# Patient Record
Sex: Female | Born: 1940
Health system: Southern US, Community
[De-identification: ages and names within clinical notes are randomized; demographics above are authoritative.]

## PROBLEM LIST (undated history)

## (undated) DIAGNOSIS — I471 Supraventricular tachycardia, unspecified: Secondary | ICD-10-CM

## (undated) DIAGNOSIS — C189 Malignant neoplasm of colon, unspecified: Secondary | ICD-10-CM

## (undated) DIAGNOSIS — E785 Hyperlipidemia, unspecified: Secondary | ICD-10-CM

## (undated) DIAGNOSIS — M1990 Unspecified osteoarthritis, unspecified site: Secondary | ICD-10-CM

## (undated) DIAGNOSIS — I10 Essential (primary) hypertension: Secondary | ICD-10-CM

## (undated) DIAGNOSIS — R011 Cardiac murmur, unspecified: Secondary | ICD-10-CM

## (undated) DIAGNOSIS — I4719 Other supraventricular tachycardia: Secondary | ICD-10-CM

## (undated) DIAGNOSIS — B029 Zoster without complications: Secondary | ICD-10-CM

## (undated) DIAGNOSIS — IMO0002 Reserved for concepts with insufficient information to code with codable children: Secondary | ICD-10-CM

## (undated) DIAGNOSIS — I499 Cardiac arrhythmia, unspecified: Secondary | ICD-10-CM

## (undated) DIAGNOSIS — E119 Type 2 diabetes mellitus without complications: Secondary | ICD-10-CM

## (undated) DIAGNOSIS — E039 Hypothyroidism, unspecified: Secondary | ICD-10-CM

## (undated) DIAGNOSIS — K5190 Ulcerative colitis, unspecified, without complications: Secondary | ICD-10-CM

## (undated) HISTORY — DX: Hyperlipidemia, unspecified: E78.5

## (undated) HISTORY — DX: Supraventricular tachycardia, unspecified: I47.10

## (undated) HISTORY — DX: Reserved for concepts with insufficient information to code with codable children: IMO0002

## (undated) HISTORY — DX: Zoster without complications: B02.9

## (undated) HISTORY — DX: Type 2 diabetes mellitus without complications: E11.9

## (undated) HISTORY — PX: COLONOSCOPY: SHX174

## (undated) HISTORY — DX: Hypothyroidism, unspecified: E03.9

## (undated) HISTORY — DX: Essential (primary) hypertension: I10

## (undated) HISTORY — DX: Ulcerative colitis, unspecified, without complications: K51.90

## (undated) HISTORY — PX: HIP ARTHROPLASTY: SHX981

## (undated) HISTORY — PX: LUMBAR FUSION: SHX111

## (undated) HISTORY — DX: Supraventricular tachycardia: I47.1

## (undated) HISTORY — DX: Malignant neoplasm of colon, unspecified: C18.9

---

## 1999-10-26 HISTORY — PX: COLON RESECTION: SHX5231

## 2000-07-07 ENCOUNTER — Encounter: Payer: Self-pay | Admitting: General Surgery

## 2000-07-07 ENCOUNTER — Encounter: Admission: RE | Admit: 2000-07-07 | Discharge: 2000-07-07 | Payer: Self-pay | Admitting: General Surgery

## 2000-07-12 ENCOUNTER — Encounter: Payer: Self-pay | Admitting: General Surgery

## 2000-07-15 ENCOUNTER — Encounter (INDEPENDENT_AMBULATORY_CARE_PROVIDER_SITE_OTHER): Payer: Self-pay | Admitting: Specialist

## 2000-07-15 ENCOUNTER — Inpatient Hospital Stay (HOSPITAL_COMMUNITY): Admission: RE | Admit: 2000-07-15 | Discharge: 2000-08-03 | Payer: Self-pay | Admitting: General Surgery

## 2000-07-25 ENCOUNTER — Encounter: Payer: Self-pay | Admitting: General Surgery

## 2000-07-26 ENCOUNTER — Encounter: Payer: Self-pay | Admitting: General Surgery

## 2000-07-29 ENCOUNTER — Encounter: Payer: Self-pay | Admitting: General Surgery

## 2000-07-31 ENCOUNTER — Encounter: Payer: Self-pay | Admitting: General Surgery

## 2000-08-01 ENCOUNTER — Encounter: Payer: Self-pay | Admitting: General Surgery

## 2004-11-23 ENCOUNTER — Ambulatory Visit: Payer: Self-pay | Admitting: Oncology

## 2005-12-02 ENCOUNTER — Ambulatory Visit: Payer: Self-pay | Admitting: Oncology

## 2006-03-02 ENCOUNTER — Inpatient Hospital Stay (HOSPITAL_COMMUNITY): Admission: RE | Admit: 2006-03-02 | Discharge: 2006-03-07 | Payer: Self-pay | Admitting: Orthopaedic Surgery

## 2007-06-24 IMAGING — CR DG LUMBAR SPINE 2-3V
1 series · 1 of 1 positions shown · non-contrast
Comparison: None.

CLINICAL DATA: Spondylolisthesis with foraminal stenoses.  L-4 through S-1 fusion.  
 INTRAOPERATIVE LUMBAR SPINE ? 3 VIEW:

[view not recorded]
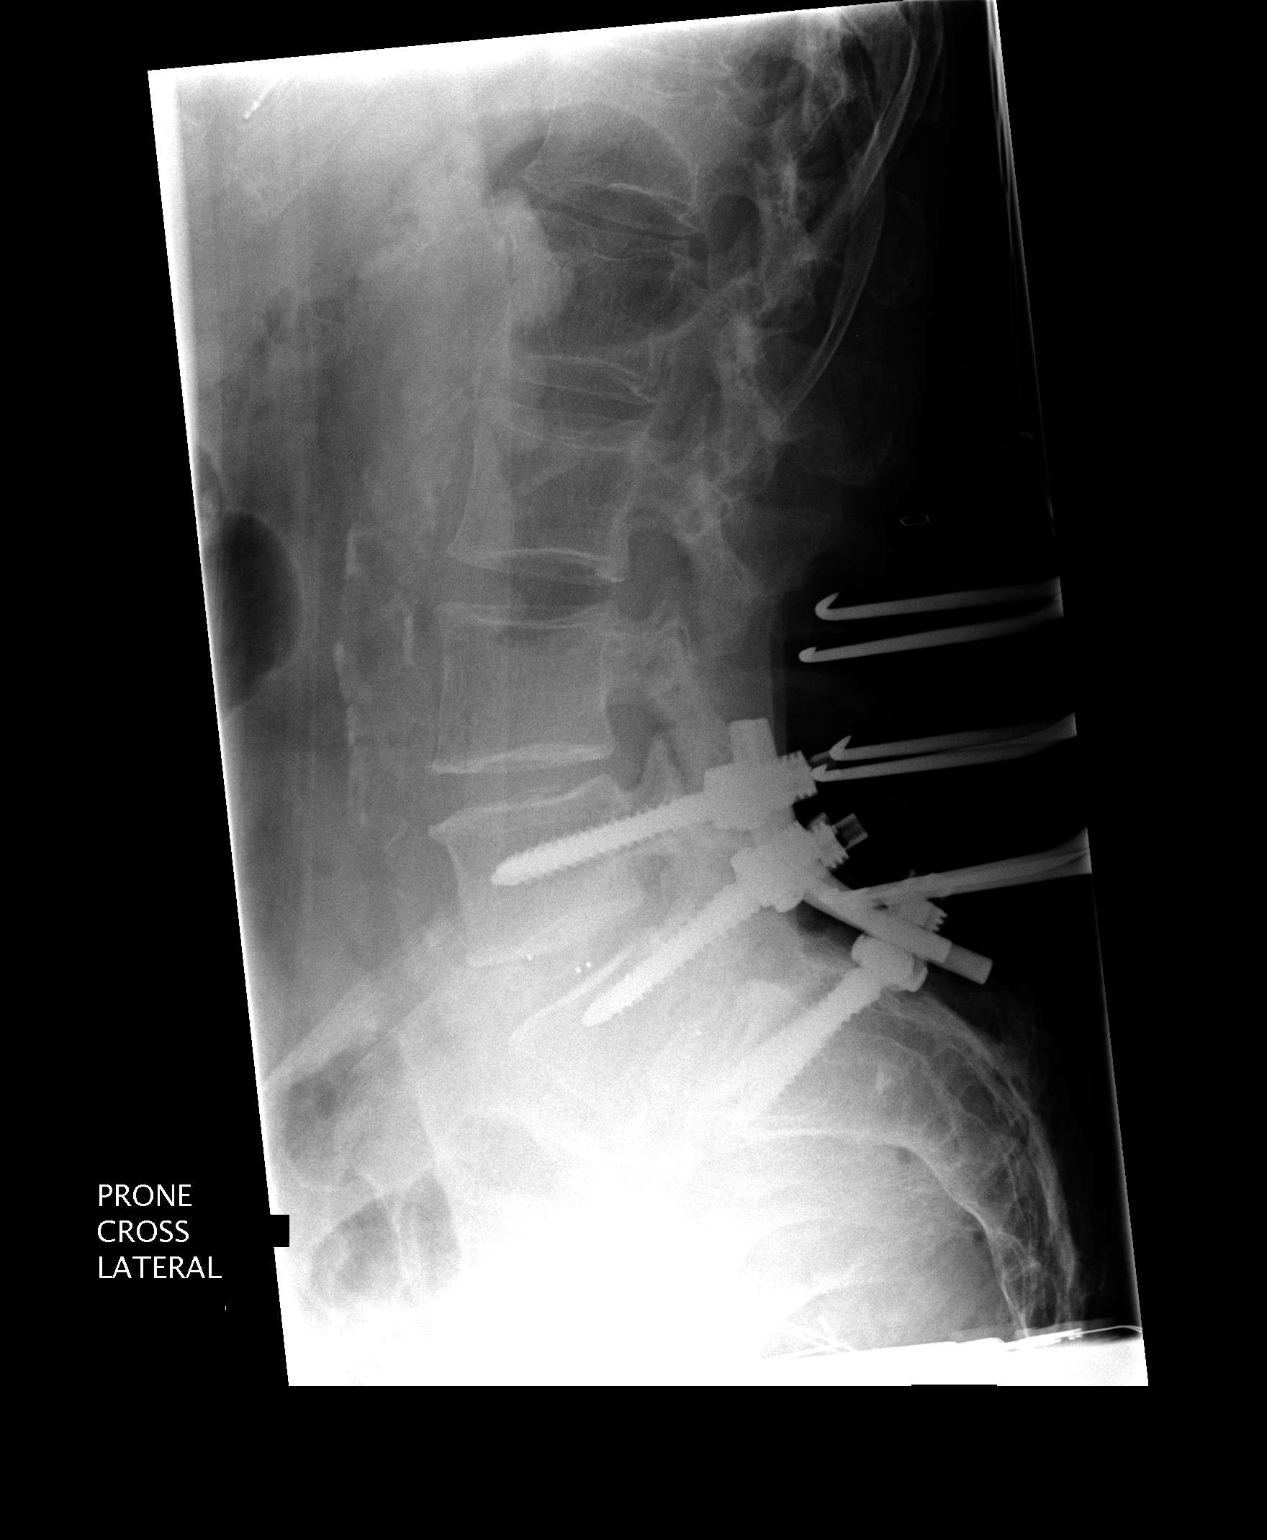

[1 of 1 positions shown; findings below may reference images not displayed]

FINDINGS: For the purposes of this dictation, I am going to assume 5 non-rib bearing lumbar vertebrae.  These images were submitted for interpretation post-operatively.  
 The initial image obtained at [DATE] hours shows clamps on the spinous processes of L-3 and L-4.
 The second image obtained at [DATE] hours shows pedicle screws at L-4, L-5, and S-1.  An interbody fusion plug has been placed at L5-S1.  
 The 3rd image obtained at [DATE] hours shows plate and screw fixation from L-4 through S-1.  Interbody fusion plugs are present at L4-5 and L5-S1.  There has been some improvement in the L5-S1 spondylolisthesis post-fusion.
IMPRESSION: Posterolateral fusion with hardware from L-4 through S-1 with interbody fusion plugs at L4-5 and L5-S1.

## 2007-06-26 IMAGING — CR DG ABD PORTABLE 1V
1 series · 1 of 1 positions shown · non-contrast
Comparison: none

CLINICAL DATA: Status post L5-S1 fusion with abdominal distention.
 ABDOMEN ? 1 VIEW:

[view not recorded]
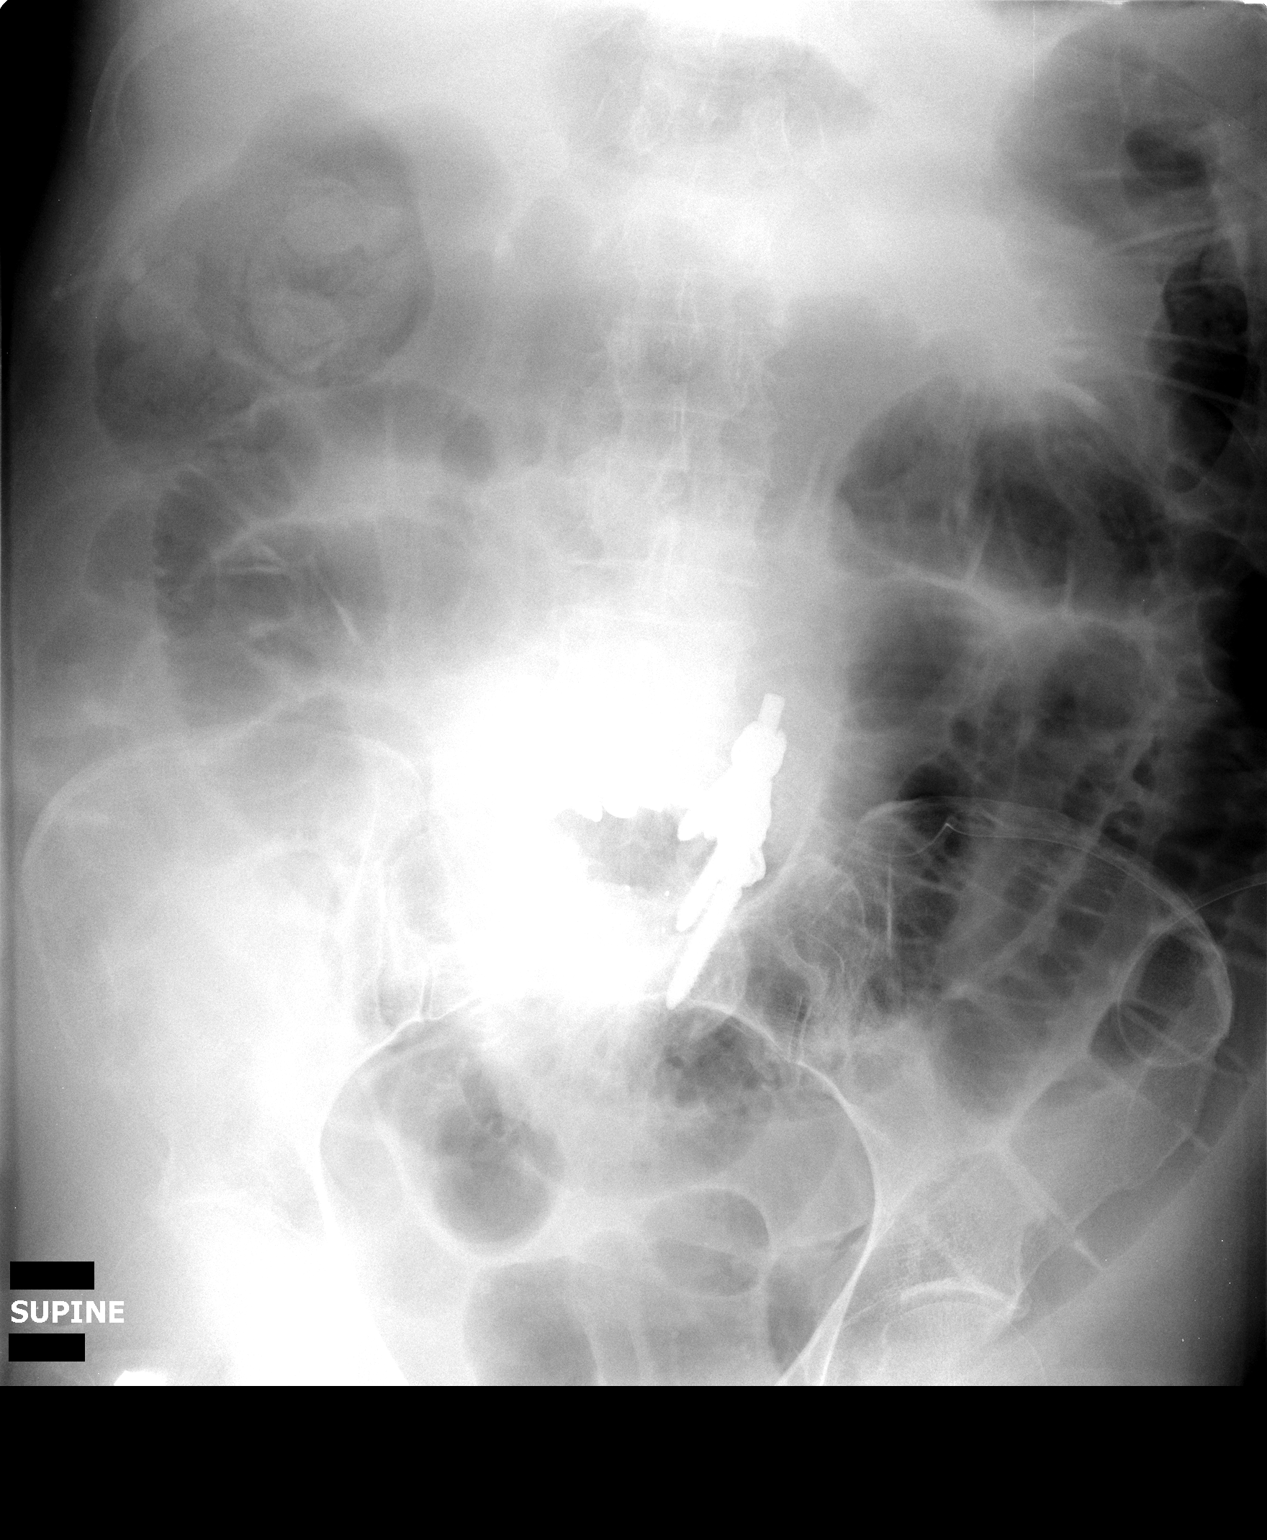

[1 of 1 positions shown; findings below may reference images not displayed]

FINDINGS: L5-S1 hardware is noted.  Diffuse gaseous distention of the small and large bowel.  This is most consistent with an ileus.  Exam is limited because of portable technique and patient size.
IMPRESSION: Diffuse gaseous distention of bowel most consistent with an ileus.

## 2011-03-12 NOTE — Discharge Summary (Signed)
[name], [name]             ACCOUNT NO.:  [id number]   MEDICAL RECORD NO.:  [id number]          PATIENT TYPE:  INP   LOCATION:  5012                         FACILITY:  [facility]   PHYSICIAN:  Sharolyn Douglas, M.D.        DATE OF BIRTH:  04-09-1941   DATE OF ADMISSION:  03/02/2006  DATE OF DISCHARGE:  03/07/2006                                 DISCHARGE SUMMARY   ADMITTING DIAGNOSES:  1.  L5-S1 spondylolisthesis.  2.  History of cardiac arrhythmia.  3.  Diabetes.  4.  History of colon cancer.   DISCHARGE DIAGNOSES:  1.  Status post L4-S1 posterior spinal fusion, doing well.  2.  Postoperative blood loss anemia, stable.  3.  Postoperative hyponatremia.  4.  Postoperative hyperglycemia.  5.  L5-S1 spondylolisthesis.  6.  History of cardiac arrhythmia.  7.  Diabetes.  8.  History of colon cancer.  9.  Postoperative ileus, resolved prior to discharge.   PROCEDURE:  On Mar 02, 2006, the patient was taken to the operating room for  an L4-S1 posterior spinal fusion with pedicle screws, Gill laminectomy of  L5, as well as a TLIF, L4-5 and L5-S1.   SURGEON:  Sharolyn Douglas, MD   ASSISTANT:  Verlin Fester, PAC.   ANESTHESIA:  General.   CONSULTS:  None.   LABS:  CBC with diff preop was within normal limits.  Postoperatively, H&H  were monitored for 3 days.  Reached a low of 10.8 and 32.2 on Mar 03, 2006.  She was stable and did not require any treatment.  PT, INR and PTT preop  normal.  Complete metabolic panel preop was normal with the exception of a  glucose of 108.  Postoperatively, basic metabolic panel was monitored for 2  days.  On postoperative day 1, glucose was 122, BUN 5; otherwise, normal.  On postoperative 2, sodium was 132, chloride 95, glucose 120, and BUN 4;  otherwise, normal.  UA from Feb 28, 2006, was negative.  Urine culture from  Feb 28, 2006, showed no growth.  Blood typing from Feb 28, 2006, was type O  positive, antibody screen negative.  EKG from Mar 02, 2006, showed  normal  sinus rhythm, possible inferior infarct, age undetermined.  There was no  previous EKG present, confirmed by Lenise Herald, MD.  X-rays from Mar 02, 2006, do show posterior fusion hardware from L4-S1 and interbody grafts at  L4-5 and 5-1 in good position and alignment.  Abdominal x-rays from Mar 04, 2006, showed gastric distension of the bowel, most consistent with an ileus.  X-rays of the lumbar spine from Mar 07, 2006, showed L4-S1 posterior spinal  fusion with grade 1 anterolisthesis at L5-1.   BRIEF HISTORY:  The patient is a 70 year old female who has had increased  back and left lower extremity pain for a period of several months.  She had  unfortunately failed conservative treatments that we had tried with her, and  her pain was progressively getting worse.  It had gotten to the point that  it was interfering with her activities of daily living,  as well as her  quality of life was suffering significantly.  The risks and benefits of the  procedure that was proposed were discussed with the patient by Dr. Noel Gerold, as  well as myself.  She indicated understanding and opted to proceed.   HOSPITAL COURSE:  On Mar 02, 2006, the patient was taken to the operating  room and underwent the above-listed procedure.  She tolerated the procedure  well without any intraoperative complications and was transferred to the  recovery room in stable condition.  Blood loss was approximately 500 mL,  with 400 returned via Cell Saver.   Postoperatively, routine orthopedic spine protocol was followed.  She  progressed along relatively well.  She did develop some abdominal distension  on Mar 04, 2006.  An x-ray was done of her abdomen as previously dictated  and did show some distension of her bowels.  Her medications were changed.  Her Colace was changed to Peri-Colace.  She was started on Reglan, and  laxative and enemas were started on an as-needed basis.  By the following  day, she was doing  much better.  She had passed gas, as well as had a bowel  movement.  Thought her ileus had resolved.  Her diet was slowly advanced  from that point forward, and she continued to make good progress.  By Mar 10, 2006, the patient was doing very well.  She had progressed well with  physical therapy.  She was eating and drinking without difficulty.  Her  abdomen was decompressed.  It was nontender and nondistended.   DISCHARGE PLAN:  The patient is a 70 year old female status post posterior  spinal fusion, doing well.  Activities:  Daily ambulation, brace on when she  is up, back precautions at all times, no lifting heavier than 5 pounds,  dressing changes daily, keep incision dry until all drainage stops.  Follow  up 2 weeks postoperatively with Dr. Noel Gerold.  Call for appointment.   MEDICATIONS ON DISCHARGE:  Resume home medications.  Percocet for pain.  Robaxin for muscle spasm.  Multivitamin daily.  Calcium daily.  Laxative as  needed.   DIET ON DISCHARGE:  Resume her preoperative diet.   CONDITION ON DISCHARGE:  Stable, improved.   DISPOSITION:  The patient being discharged to her home with her family's  assistance, as well as Home Health Physical Therapy and Occupational  Therapy.      Verlin Fester, P.A.      Sharolyn Douglas, M.D.  Electronically Signed    CM/MEDQ  D:  04/28/2006  T:  04/28/2006  Job:  [id number]

## 2011-03-12 NOTE — Op Note (Signed)
[name], BAAR             ACCOUNT NO.:  [id number]   MEDICAL RECORD NO.:  [id number]          PATIENT TYPE:  INP   LOCATION:  2889                         FACILITY:  [facility]   PHYSICIAN:  Sharolyn Douglas, M.D.        DATE OF BIRTH:  02-Jun-1941   DATE OF PROCEDURE:  03/02/2006  DATE OF DISCHARGE:                                 OPERATIVE REPORT   DIAGNOSES:  1.  Isthmic spondylolisthesis, L5-S1 with severe foraminal narrowing, left      greater than right.  2.  L4-5 degenerative disk disease with foraminal narrowing.  3.  Chronic back and left greater than right leg pain.   POSTOPERATIVE DIAGNOSES:  1.  Isthmic spondylolisthesis, L5-S1 with severe foraminal narrowing, left      greater than right.  2.  L4-5 degenerative disk disease with foraminal narrowing.  3.  Chronic back and left greater than right leg pain.   PROCEDURE:  1.  L4-5 and L5-S1 laminectomy with wide decompression of the thecal sac and      nerve roots bilaterally.  2.  Transforaminal lumbar interbody fusion, L4-5 and L5-S1 with placement of      two PEEK prosthetic cages.  3.  Segmental pedicle screw instrumentation, L4-S1 using the Abbott spine      system.  4.  Posterior spinal arthrodesis, L4-S1  5.  Local autogenous bone graft supplemented with bone morphogenic protein.   SURGEON:  Sharolyn Douglas, MD   ASSISTANT:  Verlin Fester, P.A.   ANESTHESIA:  General endotracheal.   ESTIMATED BLOOD LOSS:  500 mL.   COMPLICATIONS:  None.   INDICATIONS:  The patient is a pleasant 70 year old female with  progressively worsening back and left greater than right leg pain.  She has  grade 1 isthmic spondylolisthesis at L5-S1, retrolisthesis of L4 on 5.  There is severe foraminal narrowing, left greater than right at L5-S1 and  right greater than left at L4-5.  She has failed to respond to all  conservative treatment measures and now elects to undergo lumbar  decompression and fusion in hopes of improving her  symptoms.  Risk and  benefits were reviewed.   PROCEDURE:  The patient was identified in the holding area, taken to the  operating room.  She underwent general endotracheal anesthesia without  difficulty given prophylactic IV antibiotics.  Neuro monitoring was  established form of SSEPs and lower extremity EMGs.  She was then turned  prone onto the [place] frame.  All bony prominences padded.  Face eyes  protected all times.  Back prepped, draped usual sterile fashion.  Midline  incision made from L3 down to the sacrum.  Dissection was carried through  the deep fascia.  Subperiosteal exposure carried out to the tips the  transverse process of L4-L5 and sacral ala bilaterally.  The L5 spinous  process was loose consistent with the pars fracture.  Deep retractors were  placed.  Intraoperative x-ray taken to confirm levels.  The lordotic angle  was increased due to the spondylolisthesis at L5-S1.  The L4-5 facette  joints were impinging on the L5-S1 joints.  We then turned our attention to  completing a lumbar laminectomy by removing the entire spinous process and  lamina of L5.  The pars fracture was identified.  The fibrocartilaginous  material was decompressed from the canal.  We noted on the left side that  the L5 nerve roots were being encumbered by the fibrocartilaginous material  and also tension between the bulging disk of L5-S1 and the pedicle of L5  within the foramen.  Similar findings were found on the right side but less  severe.  At L4-5 the spinous process and lamina again was removed.  We found  a ruptured disk into the foramen on the right side at L4-5.  Once we were  satisfied with decompression the thecal sac and nerve roots on both sides we  turned our attention to placing pedicle screws at L4, L5-S1 using anatomic  probing technique.  We could palpate the pedicles from the spinal canal.  Each pedicle hole was started with the pedicle wall followed by the pedicle  probe.   The bone quality was soft.  The pedicles were tapped.  We placed 6.5  x 50 mm screws at L4, 6.5 x 40 mm screw at L5 on the left and a 6.5 x 45 mm  screw at L5 on the right.  7.5 x 45 mm screws were used in the sacrum.  We  then turned our attention to completing a transforaminal lumbar interbody  fusion at L4-5 and L5-S1.  Starting on the left side at L5-S1, the remaining  facette joint was osteotomized.  The exiting transversing nerve roots were  identified.  The disk space was entered.  We then dilated the disk space up  to 9 mm using intervertebral spreaders.  TLIF instruments were used to  complete a radical diskectomy across the contralateral side, scraping  cartilaginous endplates.  The disk space was packed with local bone graft  obtained from the laminectomy along with BMP sponges from the medium Infuse  kit.  We then inserted a 9-mm PEEK cage packed with a BMP sponge into the L5-  S1 interspace tamped it anteriorly and across the midline.  This provided  distraction and the L5 nerve root was found to be decompressed within the  foramen once the TLIF had been placed.  We then performed a similar  procedure on the right side at L4-5.  Again the remaining facette joint was  osteotomized.  The disk space was entered and cleaned out.  This space  dilated up to 11 mm.  BMP and local bone graft packed into the interspace  followed by the 11-mm PEEK cage.  This again was tamped anteriorly and  across the midline.  Intraoperative x-ray showed appropriate positioning of  the pedicle screws and the interbody TLIF grafts.  We then turned our  attention to completing the posterior spinal arthrodesis by decorticating  the transverse process of L4, L5 and S1 bilaterally.  Remaining local bone  graft was tightly packed into the lateral gutters.  70 mm titanium rods were  bent into lordosis and placed in the polyaxial screw heads.  We applied  gentle compression across the L4-5 segment.  We sheared  off the locking caps.  Cross connector was placed.  Wound was irrigated.  Hemostasis  achieved.  Gelfoam left over the exposed epidural space.  Deep fascia closed  with a #1 Vicryl suture.  Subcutaneous layer closed with 0 Vicryl and 2-0  Vicryl followed by a running 3-0 subcuticular Vicryl  suture on the skin  edges.  Benzoin, Steri-Strips placed.  Sterile dressing applied.  Needle and  sponge count was correct.  It should be noted that my assistant helped  throughout the entire case including during the exposure as well as with the  pedicle screws and the decompression working with the sucker and nerve root  retractors.  She monitored the exiting and transversing nerve roots during  the T11 procedures.  And then assisted with wound closure.      Sharolyn Douglas, M.D.  Electronically Signed     MC/MEDQ  D:  03/02/2006  T:  03/03/2006  Job:  [id number]

## 2011-03-12 NOTE — Consult Note (Signed)
[facility]  Patient:    [name], [name]                    MRN: [id number] Adm. Date:  [id number] Attending:  Carson Myrtle CC:         Sheppard Plumber. Earlene Plater, M.D.  Rose Phi. Maple Hudson, M.D.  Maebelle Munroe, M.Ronnald Nian, [place]   Consultation Report  DATE OF BIRTH:  [birth year]  REASON FOR CONSULTATION:  Colon cancer.  HISTORY OF PRESENT ILLNESS:  The patient is a 70 year old woman who was found with OB positive stools at her regular physical in 2000.  She underwent colonoscopy in August 2000 and was found with colitis and a few polyps.  She was also found with iron-deficiency anemia and started on iron around the same time.  The polyps were biopsied and were benign.  She has been intermittently on iron for the last one year.  On her routine physical in 2001, she had again OB positive stools.  Colonoscopy was peformed on June 17, 2000 and a mass in the proximal ascending colon was seen.  There were polyps in the proximal sigmoid colon and rectum.  There was no colitis.  The biopsy proved to be invasive, moderately differentiated adenocarcinoma.  The rectosigmoid polyp was a tubular adenoma.  The patient then underwent CAT scan of her abdomen and pelvis that showed mural thickening of the ascending colon with slight adjacent infiltration of the mesenteric fat.  There was no evidence for metastatic disease in the abdomen or pelvis.  She had degenerative disk disease with possible spondylolisthesis, as well.  On September, a chest x-ray was done, and it was free of metastatic disease.  On September 21, the patient underwent a right colectomy and, on the pathology specimen, the tumor was 7 cm in diameter.  It was a moderately differentiated colonic adenocarcinoma extending into muscularis propria without lymph node metastases.  All of the specimen margins were free of tumor, with 0 out of 14 lymph nodes involved by cancer.  There was some  desmoplastic reaction noted in and beyond the muscularis propria, but there were no malignant glands identified in that area.  I was asked to see the patient in consultation for her colon cancer.  PAST MEDICAL HISTORY: 1. "Irregular heart beat." 2. Question hypothyroidism on Synthroid. 3. Iron replacement for a year for presumed iron-deficiency anemia.  ALLERGIES:  ASPIRIN with GI INTOLERANCE.  MEDICATIONS PRIOR TO ADMISSION:  Synthroid.  Hormone replacement therapy. Lipitor.  Toprol XL.  Dilacor.  Ferrous sulfate.  SOCIAL HISTORY:  She lives near [place].  She quit smoking three years ago and did smoke for 30 years.  She does not drink alcohol.  She is employed, married and lives with her husband.  She has one son.  FAMILY HISTORY:  Her father passed away at the age of 85 of metastatic cancer reaching into the skin.  REVIEW OF SYSTEMS:  CONSTITUTIONAL: The patient felt good.  No weight loss. No night sweats.  No fever.  Good level of energy and good appetite prior to her surgery.  EARS, NOSE AND THROAT: No complaints.  EYES: No complaints. GASTROINTESTINAL: No regurgitation.  No dysphagia, odynophagia, nausea, vomiting, abdominal pain, diarrhea, constipation, black stools or blood stools.  CARDIOVASCULAR AND RESPIRATORY: No palpitations.  No chest pain. No shortness of breath.  No cough.  MUSCULOSKELETAL: Some arthritis in her knees. GENITOURINARY: No dysuria.  No frequency.  LABORATORY DATA:  See the history  of present illness for pathology, chest x-ray and CAT scan.  On July 04, 2000, white count 8.9, hemoglobin 11.1, MCV 83.1, platelets 505.  Sodium 141, BUN 12, creatinine 0.8, albumin 3.8.  CEA 1.4.  PHYSICAL EXAMINATION:  GENERAL:  Middle-age woman in no apparent distress.  Comfortable in bed.  VITAL SIGNS:  Blood pressure 158/84, heart rate 89, respirations 20, temperature 97.  HEENT:  Normocephalic and atraumatic.  Pupils equal, round and reactive to light  and accommodation.  Extraocular movements intact.  Ear, nose and throat grossly normal.  NECK:  No JVD.  No lymphadenopathy.  No thyromegaly.  No bruits.  CHEST:  Clear to auscultation bilaterally.  HEART:  Regular.  ABDOMEN:  Slightly distended.  It is dressed from the recent surgery. Minimally tender around the wound.  Bowel sounds are absent.  EXTREMITIES:  There is no peripheral edema.  NEUROLOGIC:  The patient is grossly intact.  IMPRESSION AND PLAN: T2, N0, M0 (stage 1) colorectal cancer, moderately differentiated, status post right hemicolectomy on July 15, 2000.  The patients outcome should be excellent with 90% long term disease free survival.  I did arrange follow up with Dr. Maebelle Munroe in [place] on August 03, 2000 at 2:45 p.m.   I discussed with the patient and her spouse about her condition, diagnosis, prognosis and follow up.  All of their questions were answered. DD:  07/19/00 TD:  07/19/00 Job: [id number]

## 2011-03-12 NOTE — Op Note (Signed)
[facility]  Patient:    [name], [name]                    MRN: [id number] Proc. Date: 07/15/00 Adm. Date:  [id number] Attending:  Carson Myrtle                           Operative Report  PREOPERATIVE DIAGNOSIS:  Carcinoma, right colon.  POSTOPERATIVE DIAGNOSIS:  Carcinoma, right colon.  PROCEDURE:  Right colectomy.  SURGEON:  Timothy E. Earlene Plater, M.D.  ASSISTANT:  Gita Kudo, M.D.  ANESTHESIA:  CRNA, supervised M.D.  DESCRIPTION OF PROCEDURE:  The patient was taken to the operating room, placed supine.  General endotracheal anesthesia was administered.  A nasogastric tube, PAS hose, and Foley catheter were placed.  The abdomen was scrubbed, prepped, and draped in the usual fashion.  A short midline incision was used around the umbilicus to the right.  The fascia was identified, opened in the midline, and the peritoneum was entered.  Careful palpation of the upper abdomen was negative, most particularly, the liver, gallbladder, and stomach area.  She had had a recent colonoscopy, knowing that the entire colon was negative except for the mid-ascending colon, which in fact contained a carcinoma.  It was sizeable, apple-core type, had punctuated the serosa but did not appear to have invaded the serosa.  There was no evidence of metastases.  The small bowel was normal in its entirety.  The pelvis showed an atrophic uterus and bilateral normal ovaries.  We divided the lateral peritoneal reflection of the right colon widely and reflected the entire right colon up into the incision.  We chose lines of resection at the junction of the hepatic flexure and transverse colon and the terminal ileum.  These areas were dissected, cut across with the GIA staple device, and then the mesentery was excised widely down to the apex of the mesentery, and the entire specimen passed off the field.  The clamps on the mesentery were carefully tied with 2-0  silk, and the area was dry.  There was no blood and no blood loss.  The duodenum, kidney, ureter were all kept posterior to the dissection. Anastomosis was created in a typical side-to-side fashion between the terminal ileum and the transverse colon by applying a GIA staple device, firing the device.  We inspected the anastomosis.  It was intact, and there was no bleeding.  The puncture sites were closed with an additional firing of the GIA staple device, which completed the anastomosis.  This lay nicely in the right upper quadrant.  The mesentery was carefully closed with interrupted sutures. All areas were inspected.  There was no bleeding.  The small bowel was allowed to lay in the midabdomen in its normal position.  The omentum was pulled down over this and with the counts correct, the abdomen was closed in a single layer with #1 PDS suture.  Subcutaneous tissue was copiously irrigated.  The skin was closed with _____ staples.  Again, the second count was correct. She had tolerated it well, and she was removed to the recovery room in good condition. DD:  07/15/00 TD:  07/18/00 Job: [id number]

## 2011-03-12 NOTE — H&P (Signed)
[name], [name]             ACCOUNT NO.:  [id number]   MEDICAL RECORD NO.:  [id number]          PATIENT TYPE:  INP   LOCATION:  NA                           FACILITY:  [facility]   PHYSICIAN:  Verlin Fester, P.A.    DATE OF BIRTH:  1941-04-25   DATE OF ADMISSION:  03/02/2006  DATE OF DISCHARGE:                                HISTORY & PHYSICAL   CHIEF COMPLAINT:  Low back and left lower extremity pain.   HISTORY OF PRESENT ILLNESS:  The patient is a 70 year old female who has  been having increasing back and left lower extremity pain for a period of  several months now.  She has unfortunately failed all conservative  treatments.  She never had any lasting results.  Her pain is increasing and  interfering with activities of daily living and quality of life.  Secondary  to her physical exam findings, failure to improve to with conservative  management as well as her x-ray and MRI findings, it was felt her best  course of management would be posterior spinal fusion at L5-S1, possibly at  L4-5.  Risks and benefits of the procedure were discussed with the patient  by Dr. Noel Gerold as well as myself.  She indicated understanding and opts to  proceed.   ALLERGIES:  NONE.   MEDICATIONS:  Toprol, diltiazem, Lipitor, metformin, Synthroid, Arthrotec,  and aspirin.  She stopped aspirin in preparation for surgery.   PAST MEDICAL HISTORY:  Cardiac arrhythmia which is currently stable.  Diabetes and history of colon cancer.   PAST SURGICAL HISTORY:  Colon resection.   SOCIAL HISTORY:  The patient denies tobacco use, denies alcohol use.  She is  widowed.  She has a son and daughter-in-law who will be helping her through  her postoperative course.  They will be staying with her.  They do work full  time.   FAMILY HISTORY:  Family medical history is noncontributory.   REVIEW OF SYSTEMS:  The patient denies fevers, chills, sweats or bleeding  tendencies.  CNS:  Denies blurred vision, double  vision, seizures, headache,  paralysis.  CARDIOVASCULAR:  Denies chest pain, angina, orthopnea,  claudication and palpitations.  PULMONARY:  Denies shortness of breath,  productive cough or hemoptysis.  GI:  Denies nausea, vomiting, constipation,  diarrhea, melena or bloody stools.  GU:  Denies dysuria, hematuria or  discharge.  MUSCULOSKELETAL:  As per HPI.   PHYSICAL EXAMINATION:  VITAL SIGNS:  Blood pressure is 140/78, respirations  are 20 and unlabored, pulse is 92 and regular.  GENERAL APPEARANCE:  This is a 70 year old white female who is alert and  oriented in no acute distress, well nourished, well groomed, appears stated  age, pleasant and cooperative to exam.  HEENT:  Head is normocephalic, atraumatic.  Pupils equal, round, reactive.  Extraocular movements intact.  Nares patent.  Pharynx clear.  Neck is soft  to palpation.  No bruits appreciated.  No lymphadenopathy or thyromegaly  noted.  CHEST:  Clear to auscultation bilaterally.  No rales, rhonchi, stridor,  wheezes or friction rubs.  BREASTS:  Not pertinent, not performed.  HEART:  S1 and S2 regular rate and rhythm.  No murmurs, gallops or rubs  noted.  ABDOMEN:  Soft to palpation, nontender, nondistended.  No organomegaly  noted.  Positive bowel sounds throughout.  GU:  Not pertinent, not performed.  EXTREMITIES:  As per HPI.  SKIN:  Intact without any lesions or rashes.   IMPRESSION:  1.  L5-S1 spinal listhesis with chronic pain and radiculopathy.  2.  History of cardiac arrhythmia, currently stable.  3.  Diabetes.  4.  History of colon cancer status post partial colon resection.   PLAN:  1.  Admit to [facility] on Mar 02, 2006 for an L5-S1 posterior      spinal fusion and a possible L4-5 fusion.  This will be done by Dr. Sharolyn Douglas.  2.  The patient's primary care doctor is Dr. Belva Crome in [place], [place].  She has been cleared preoperatively to undergo surgery.  3.  The patient was  fitted with a lumbar LSO brace today that she will use      for her postoperative course that is medically necessary.      Verlin Fester, P.A.     CM/MEDQ  D:  03/01/2006  T:  03/01/2006  Job:  [id number]

## 2011-03-12 NOTE — Discharge Summary (Signed)
[facility]  Patient:    [name], [name]                    MRN: [id number] Adm. Date:  [id number] Disc. Date: [id number] Attending:  Carson Myrtle                           Discharge Summary  FINAL DIAGNOSES:  Adenocarcinoma, right colon, prolonged postoperative ileus.  HISTORY OF PRESENT ILLNESS:  The patient was seen, evaluated and worked up by Dr. Derry Skill in [place] for known iron deficiency anemia. She had had a colonoscopy 1 year prior in September of 2000, no lesions were seen. She continued on iron and she continued to be anemic. A repeat colonoscopy and upper endoscopy were done and showed a carcinoma right colon. She was referred for definitive surgery. She was counseled in the office, prepared at home, brought into the hospital on the 21st and operated and underwent a right colectomy. Her initial postoperative evaluation and care were routine. She remained afebrile and had no early flatus or bowel action. We had not used a nasogastric tube at surgery. We tried some clear liquids and Dulcolax suppositories. She was then passing stool and flatus but made almost no progress in terms of the actual size of the abdomen or other physical findings. Finally, she became nauseated with vomiting and the nasogastric tube was placed on July 24, 2000. Stools were checked for C. difficile. The laboratory data were followed as well. Flat and upright abdomen x-rays were followed which showed changes consistent with ileus. Laboratory data were okay. C diff levels were negative. We had discussed carefully with her, the options for TPN versus reoperation versus time. She elected for time only and finally with the bowels continuing to work the abdomen became softer, the vomiting stopped and she tolerated a diet and was ready for discharge on the 18th.  Her laboratory data showed a hemoglobin of 11.6 on admission, 11.5 on discharge, again she  was known to be iron deficiency. C. difficile toxins were negative. EKG showed borderline ECG. Chest x-ray was negative. Final path report showed slight colonic carcinoma moderately differentiated extending into the muscularis propria without lymph node metastasis. This is a T2N0. She was seen by oncology and they deferred treatment at this time and will see her in follow-up. That appointment is made with Dr. Maebelle Munroe in [place] in early November.  The patient is discharged home with pain medication to resume her usual medications and will be followed from the office. DD:  08/11/00 TD:  08/11/00 Job: [id number]

## 2011-03-12 NOTE — H&P (Signed)
[facility]  Patient:    [name], [name]                    MRN: [id number] Adm. Date:  [id number] Attending:  Carson Myrtle                         History and Physical  ADMITTING DIAGNOSIS:  Carcinoma of right colon.  HISTORY OF PRESENT ILLNESS:  This is a 70 year old Caucasian female from [place] who sees and follows up with Dr. Beulah Gandy. Sheria Lang and has seen Dr. Garnette Czech.  She has a known iron deficiency anemia.  A GI workup was completed one year ago and was negative and upon this workup at this time, a right colon carcinoma was identified in the mid-ascending colon.  Patient has been carefully advised and consulted and wishes to proceed urgently with this surgery as is planned.  She is known to be anemic and has been on iron.  Her bowels have never been constipated, in fact, tend to be loose.  There was a question of diagnosis of colitis last year but no treatment was accomplished.  No other history of GI problems.  Patient has hypertension and is on Diltiazem 240 mg a day and Toprol XL 50 mg a day, history of low thyroid, on Synthroid 0.15 mg a day, high cholesterol, Lipitor 20 mg a day, and postmenopausal, on replacement estrogen.  ALLERGIES:  No drug allergies.  PAST SURGICAL HISTORY:  No prior surgery.  SOCIAL HISTORY:  Patient stopped smoking in 1998.  She does not drink alcohol.  OBSTETRICAL HISTORY:  She is a gravida 1, para 1.  FAMILY HISTORY:  Father died with cancer, thought to be pancreas, exact site unknown.  Mother deceased, no disease known.  Two brothers and two sisters alive and well.  LABORATORY DATA AND IMAGING STUDIES:  CT scan done of the abdomen showing evidence of carcinoma, right colon, no other disease noted.  CEA level 1.4.  Admitting hemoglobin 11 and hematocrit 33.  Chemistry profile normal.  PHYSICAL EXAMINATION  GENERAL:  A pleasant Caucasian female, conversant, informed, of stated age. Her  weight is 214 pounds.  VITAL SIGNS:  Heart rate is 80.  Blood pressure is 120/80.  HEENT:  Unremarkable.  NECK:  Free of masses.  CHEST:  Clear to auscultation.  HEART:  Heart sounds are of normal quality, regular rhythm.  BREASTS:  Not examined at this time.  ABDOMEN:  Quite large and rotund.  No incisions.  No herniae.  There is some tenderness in the right lower quadrant but no particular mass.  RECTAL:  Stools are known to be Hemoccult-positive.  ASSESSMENT:  Patient is known to have an iron deficiency anemia and a biopsy-proven adenocarcinoma of the right colon.  Again, patient is now prepared and wishes to proceed with this surgery as has been carefully discussed. DD:  07/16/99 TD:  07/16/00 Job: [id number]

## 2014-08-20 ENCOUNTER — Encounter: Payer: Self-pay | Admitting: *Deleted

## 2014-08-21 ENCOUNTER — Encounter: Payer: Self-pay | Admitting: *Deleted

## 2014-08-21 ENCOUNTER — Encounter: Payer: Self-pay | Admitting: Cardiovascular Disease

## 2014-08-21 ENCOUNTER — Ambulatory Visit (INDEPENDENT_AMBULATORY_CARE_PROVIDER_SITE_OTHER): Payer: Medicare Other | Admitting: Cardiovascular Disease

## 2014-08-21 VITALS — BP 120/75 | HR 72 | Ht 67.0 in | Wt 227.0 lb

## 2014-08-21 DIAGNOSIS — I10 Essential (primary) hypertension: Secondary | ICD-10-CM

## 2014-08-21 DIAGNOSIS — I471 Supraventricular tachycardia, unspecified: Secondary | ICD-10-CM

## 2014-08-21 DIAGNOSIS — E1310 Other specified diabetes mellitus with ketoacidosis without coma: Secondary | ICD-10-CM

## 2014-08-21 DIAGNOSIS — E1110 Type 2 diabetes mellitus with ketoacidosis without coma: Secondary | ICD-10-CM

## 2014-08-21 DIAGNOSIS — E785 Hyperlipidemia, unspecified: Secondary | ICD-10-CM

## 2014-08-21 NOTE — Assessment & Plan Note (Signed)
Continue beta blocker try to get strips from urgent care [place].  Continue beta blocker Echo r/o structural heart disease Refer to EP for possible ablation / AV node modification at patient request

## 2014-08-21 NOTE — Assessment & Plan Note (Signed)
Discussed low carb diet.  Target hemoglobin A1c is 6.5 or less.  Continue current medications.  

## 2014-08-21 NOTE — Assessment & Plan Note (Signed)
Well controlled.  Continue current medications and low sodium Dash type diet.    

## 2014-08-21 NOTE — Progress Notes (Addendum)
Patient ID: [name], female   DOB: [birth year], 73 y.o.   MRN: [id number]    73 yo referred by Dr Burnett Sheng for arrhythmia.  CRF  HTN, elevated lipids and poorly controlled diabetes  Review of recent labs shows A1c 7.8  Also history of low T4 On replacement  TSH 2.9  Has had palpitations for years  Ok with beta blocker Last month worse with 4-5 episodes of presumed SVT  Sudden onset can last hours Episode last week at urgent care [place].  Report indicates SVT rate 180 no strips  Slowed on its own  Has had adenosine once.  Doesn't think beta blocker working As well  Tries vagal maneuvers first but don't help.  Thinks recent addition of Actos has made things worse  She is sedentary no chest pain mild exertional dyspnea Widowed but has lots of family in area  She is interested in ablation     ROS: Denies fever, malais, weight loss, blurry vision, decreased visual acuity, cough, sputum, SOB, hemoptysis, pleuritic pain, palpitaitons, heartburn, abdominal pain, melena, lower extremity edema, claudication, or rash.  All other systems reviewed and negative   General: Affect appropriate Overweight white female  HEENT: normal Neck supple with no adenopathy JVP normal no bruits no thyromegaly Lungs clear with no wheezing and good diaphragmatic motion Heart:  S1/S2 no murmur,rub, gallop or click PMI normal Abdomen: benighn, BS positve, no tenderness, no AAA no bruit.  No HSM or HJR Distal pulses intact with no bruits No edema Neuro non-focal Skin warm and dry No muscular weakness  Medications Current Outpatient Prescriptions  Medication Sig Dispense Refill  . aspirin 81 MG tablet Take 81 mg by mouth daily.      . balsalazide (COLAZAL) 750 MG capsule Take 2,250 mg by mouth 3 (three) times daily.      . calcium carbonate 200 MG capsule Take 250 mg by mouth 2 (two) times daily with a meal.      . Diclofenac-Misoprostol (ARTHROTEC) 75-0.2 MG TBEC Take 75 mg by mouth 2 (two) times daily.       Marland Kitchen diltiazem (DILACOR XR) 240 MG 24 hr capsule Take 240 mg by mouth daily.      . fluticasone (FLONASE) 50 MCG/ACT nasal spray Place 2 sprays into both nostrils daily as needed for allergies or rhinitis.      Marland Kitchen levothyroxine (SYNTHROID, LEVOTHROID) 112 MCG tablet Take 112 mcg by mouth daily before breakfast.      . metoprolol succinate (TOPROL-XL) 50 MG 24 hr tablet Take 50 mg by mouth 2 (two) times daily. Take with or immediately following a meal.      . Misc Natural Products (FIBER 7 PO) Take by mouth.      . Multiple Vitamins-Minerals (CENTRUM SILVER PO) Take by mouth.      Marland Kitchen omeprazole (PRILOSEC) 20 MG capsule Take 20 mg by mouth daily.      . pioglitazone (ACTOS) 30 MG tablet Take 30 mg by mouth daily.      . pravastatin (PRAVACHOL) 80 MG tablet Take 80 mg by mouth daily.      . Probiotic Product (PROBIOTIC DAILY PO) Take 34,000,000 Units by mouth daily.       No current facility-administered medications for this visit.    Allergies Cortisone acetate; Metformin and related; and Misoprostol  Family History: Family History  Problem Relation Age of Onset  . Cancer Father   . Heart attack Mother   . Diabetes Mother   .  Arrhythmia Brother   . Cancer Sister   . Diabetes Sister     Social History: History   Social History  . Marital Status: Widowed    Spouse Name: N/A    Number of Children: N/A  . Years of Education: N/A   Occupational History  . Not on file.   Social History Main Topics  . Smoking status: Never Smoker   . Smokeless tobacco: Not on file  . Alcohol Use: No  . Drug Use: No  . Sexual Activity: Not on file   Other Topics Concern  . Not on file   Social History Narrative  . No narrative on file    Past Surgical History  Procedure Laterality Date  . Colon resection  2001    Partial  . Lumbar fusion    . Colonoscopy      Past Medical History  Diagnosis Date  . Supraventricular tachycardia   . Hyperlipidemia   . Hypertension   . Ulcerative  colitis     intermittent mild flares  . Type 2 diabetes mellitus   . Hypothyroidism   . Squamous cell carcinoma   . Shingles   . Colon cancer     stage 1    Electrocardiogram:  NSR Q wave in 3,F otherwise normal  Today NSR rate 71 normal   Assessment and Plan

## 2014-08-21 NOTE — Assessment & Plan Note (Signed)
Cholesterol is at goal.  Continue current dose of statin and diet Rx.  No myalgias or side effects.  F/U  LFT's in 6 months. No results found for this basename: LDLCALC  LDL 112 on recent labs with normal LFTS

## 2014-08-21 NOTE — Patient Instructions (Signed)
Your physician recommends that you schedule a follow-up appointment in: EP  AS SOON AS  POSSIBLE   DX SVT  Your physician recommends that you continue on your current medications as directed. Please refer to the Current Medication list given to you today.  Your physician has requested that you have an echocardiogram. Echocardiography is a painless test that uses sound waves to create images of your heart. It provides your doctor with information about the size and shape of your heart and how well your heart's chambers and valves are working. This procedure takes approximately one hour. There are no restrictions for this procedure.

## 2014-08-22 ENCOUNTER — Ambulatory Visit (HOSPITAL_COMMUNITY): Payer: Medicare Other | Attending: Cardiology | Admitting: Radiology

## 2014-08-22 ENCOUNTER — Encounter: Payer: Self-pay | Admitting: Cardiovascular Disease

## 2014-08-22 DIAGNOSIS — I471 Supraventricular tachycardia: Secondary | ICD-10-CM | POA: Insufficient documentation

## 2014-08-22 DIAGNOSIS — I10 Essential (primary) hypertension: Secondary | ICD-10-CM | POA: Insufficient documentation

## 2014-08-22 DIAGNOSIS — E669 Obesity, unspecified: Secondary | ICD-10-CM | POA: Insufficient documentation

## 2014-08-22 DIAGNOSIS — E119 Type 2 diabetes mellitus without complications: Secondary | ICD-10-CM | POA: Diagnosis not present

## 2014-08-22 DIAGNOSIS — E785 Hyperlipidemia, unspecified: Secondary | ICD-10-CM | POA: Diagnosis not present

## 2014-08-22 NOTE — Progress Notes (Signed)
Echocardiogram performed.  

## 2014-08-26 ENCOUNTER — Other Ambulatory Visit (HOSPITAL_COMMUNITY): Payer: Self-pay | Admitting: Nurse Practitioner

## 2014-08-26 ENCOUNTER — Encounter: Payer: Self-pay | Admitting: Internal Medicine

## 2014-08-26 ENCOUNTER — Ambulatory Visit (INDEPENDENT_AMBULATORY_CARE_PROVIDER_SITE_OTHER): Payer: Medicare Other | Admitting: Internal Medicine

## 2014-08-26 VITALS — BP 122/64 | HR 72 | Ht 67.0 in | Wt 229.2 lb

## 2014-08-26 DIAGNOSIS — I471 Supraventricular tachycardia: Secondary | ICD-10-CM

## 2014-08-26 NOTE — Progress Notes (Signed)
Primary Care Physician: Greig Right, MD Referring Physician: Dr. Lonell Face [name] is a 73 y.o. female with a h/o  SVT here today for EP evaluation  for catheter ablation  with history of episodes of SVT since 2004. She has had adenosine x 1  In past which was successful to convert back to NSR.She Is on metoprolol and diltiazem for management of the spells and can usually break the rapid rhythm with an extra metoprolol and valsalva maneuvers. Over the last few months, she has had more episodes,which pt contributed to use of actos and since then had her physician stop this drug. Her last episode was last week at an urgent care facility in [place], per pt, with a elevated heart rate of 180 bpm. EKG requested. She got scared when she was loaded on the stretcher to go to the hospital and rhythm broke spontaneously.. She states the  tachycardia comes on and stops very abruptly and she feels as she  may pass out. She currently avoids all caffeine and alcohol.  Today, she denies symptoms of palpitations, chest pain, shortness of breath, orthopnea, PND, lower extremity edema, dizziness, presyncope, syncope, or neurologic sequela. The patient is tolerating medications without difficulties and is otherwise without complaint today.   Past Medical History  Diagnosis Date  . Supraventricular tachycardia   . Hyperlipidemia   . Hypertension   . Ulcerative colitis     intermittent mild flares  . Type 2 diabetes mellitus   . Hypothyroidism   . Squamous cell carcinoma   . Shingles   . Colon cancer     stage 1   Past Surgical History  Procedure Laterality Date  . Colon resection  2001    Partial  . Lumbar fusion    . Colonoscopy      Current Outpatient Prescriptions  Medication Sig Dispense Refill  . aspirin 81 MG tablet Take 81 mg by mouth daily.    . calcium carbonate 200 MG capsule Take 250 mg by mouth daily.     Marland Kitchen diltiazem (DILACOR XR) 240 MG 24 hr capsule Take 240 mg by mouth daily.     . fluticasone (FLONASE) 50 MCG/ACT nasal spray Place 2 sprays into both nostrils daily as needed for allergies or rhinitis.    Marland Kitchen glimepiride (AMARYL) 4 MG tablet Take 4 mg by mouth 2 (two) times daily. 1/2 tab in the am and 1 tab in the pm    . levothyroxine (SYNTHROID, LEVOTHROID) 112 MCG tablet Take 112 mcg by mouth daily before breakfast.    . metoprolol succinate (TOPROL-XL) 50 MG 24 hr tablet Take 50 mg by mouth 2 (two) times daily. Take with or immediately following a meal.    . Misc Natural Products (FIBER 7 PO) Take by mouth.    . Multiple Vitamins-Minerals (CENTRUM SILVER PO) Take by mouth.    Marland Kitchen omeprazole (PRILOSEC) 20 MG capsule Take 20 mg by mouth daily.    . pravastatin (PRAVACHOL) 80 MG tablet Take 80 mg by mouth daily.     No current facility-administered medications for this visit.    Allergies  Allergen Reactions  . Cortisone Acetate [Cortisone] Other (See Comments)    Hyperglycemia(adverse Reaction)  . Metformin And Related Diarrhea  . Misoprostol Diarrhea    History   Social History  . Marital Status: Widowed    Spouse Name: N/A    Number of Children: N/A  . Years of Education: N/A   Occupational History  . Not on  file.   Social History Main Topics  . Smoking status: Former Research scientist (life sciences)  . Smokeless tobacco: Not on file  . Alcohol Use: No  . Drug Use: No  . Sexual Activity: Not on file   Other Topics Concern  . Not on file   Social History Narrative    Family History  Problem Relation Age of Onset  . Cancer Father   . Heart attack Mother   . Diabetes Mother   . Arrhythmia Brother   . Cancer Sister   . Diabetes Sister     ROS- All systems are reviewed and negative except as per the HPI above  Physical Exam: Filed Vitals:   08/26/14 0851  BP: 122/64  Pulse: 72  Height: 5\' 7"  (1.702 m)  Weight: 229 lb 3.2 oz (103.964 kg)    GEN- The patient is well appearing, alert and oriented x 3 today.   Head- normocephalic, atraumatic Eyes-  Sclera  clear, conjunctiva pink Ears- hearing intact Oropharynx- clear Neck- supple, no JVP Lymph- no cervical lymphadenopathy Lungs- Clear to ausculation bilaterally, normal work of breathing Heart- Regular rate and rhythm, no murmurs, rubs or gallops, PMI not laterally displaced GI- soft, NT, ND, + BS Extremities- no clubbing, cyanosis, or edema MS- no significant deformity or atrophy Skin- no rash or lesion Psych- euthymic mood, full affect Neuro- strength and sensation are intact  EKG- NSR with notched P wave, possible inferior/anterior infarct, age undetermined.  EKG, documenting  SVT, requested from [facility]   Echo- LV function at 65-70%, moderated LVH.     Assessment and Plan:  1. AVNRT- Therapeutic strategies for EP study and catheter ablation were discussed in detail with the patient today. Risk, benefits, to EP study and radiofrequency ablation of SVT were also discussed in detail today. These risks include but are not limited to  bleeding, vascular damage, tamponade, possibility of pacemaker.. The patient understands these risk and wishes to proceed.  We will therefore proceed with catheter ablation once the patient lets [place] know preferred date.  2.HTN - her blood pressure is controlled.  3. Dyslipidemia - she will continue her medical therapy. Her cholesterol will be followed by her primary MD.   Mikle Bosworth.D.

## 2014-08-26 NOTE — Patient Instructions (Signed)
Your physician has recommended that you have an ablation. Catheter ablation is a medical procedure used to treat some cardiac arrhythmias (irregular heartbeats). During catheter ablation, a long, thin, flexible tube is put into a blood vessel in your groin (upper thigh), or neck. This tube is called an ablation catheter. It is then guided to your heart through the blood vessel. Radio frequency waves destroy small areas of heart tissue where abnormal heartbeats may cause an arrhythmia to start. Please see the instruction sheet given to you today.  Dates to consider: 11/16,11/18,11/23,12/2,12/4,12/7,12/9,12/11,12/14,12/16,12/28  Cardiac Ablation Cardiac ablation is a procedure to disable a small amount of heart tissue in very specific places. The heart has many electrical connections. Sometimes these connections are abnormal and can cause the heart to beat very fast or irregularly. By disabling some of the problem areas, heart rhythm can be improved or made normal. Ablation is done for people who:   Have Wolff-Parkinson-White syndrome.   Have other fast heart rhythms (tachycardia).   Have taken medicines for an abnormal heart rhythm (arrhythmia) that resulted in:   No success.   Side effects.   May have a high-risk heartbeat that could result in death.  LET [facility] CARE PROVIDER KNOW ABOUT:   Any allergies you have or any previous reactions you have had to X-ray dye, food (such as seafood), medicine, or tape.   All medicines you are taking, including vitamins, herbs, eye drops, creams, and over-the-counter medicines.   Previous problems you or members of your family have had with the use of anesthetics.   Any blood disorders you have.   Previous surgeries or procedures (such as a kidney transplant) you have had.   Medical conditions you have (such as kidney failure).  RISKS AND COMPLICATIONS Generally, cardiac ablation is a safe procedure. However, problems can occur and  include:   Increased risk of cancer. Depending on how long it takes to do the ablation, the dose of radiation can be high.  Bruising and bleeding where a thin, flexible tube (catheter) was inserted during the procedure.   Bleeding into the chest, especially into the sac that surrounds the heart (serious).  Need for a permanent pacemaker if the normal electrical system is damaged.   The procedure may not be fully effective, and this may not be recognized for months. Repeat ablation procedures are sometimes required. BEFORE THE PROCEDURE   Follow any instructions from your health care provider regarding eating and drinking before the procedure.   Take your medicines as directed at regular times with water, unless instructed otherwise by your health care provider. If you are taking diabetes medicine, including insulin, ask how you are to take it and if there are any special instructions you should follow. It is common to adjust insulin dosing the day of the ablation.  PROCEDURE  An ablation is usually performed in a catheterization laboratory with the guidance of fluoroscopy. Fluoroscopy is a type of X-ray that helps your health care provider see images of your heart during the procedure.   An ablation is a minimally invasive procedure. This means a small cut (incision) is made in either your neck or groin. Your health care provider will decide where to make the incision based on your medical history and physical exam.  An IV tube will be started before the procedure begins. You will be given an anesthetic or medicine to help you relax (sedative).  The skin on your neck or groin will be numbed. A needle will  be inserted into a large vein in your neck or groin and catheters will be threaded to your heart.  A special dye that shows up on fluoroscopy pictures may be injected through the catheter. The dye helps your health care provider see the area of the heart that needs treatment.  The  catheter has electrodes on the tip. When the area of heart tissue that is causing the arrhythmia is found, the catheter tip will send an electrical current to the area and "scar" the tissue. Three types of energy can be used to ablate the heart tissue:   Heat (radiofrequency energy).   Laser energy.   Extreme cold (cryoablation).   When the area of the heart has been ablated, the catheter will be taken out. Pressure will be held on the insertion site. This will help the insertion site clot and keep it from bleeding. A bandage will be placed on the insertion site.  AFTER THE PROCEDURE   After the procedure, you will be taken to a recovery area where your vital signs (blood pressure, heart rate, and breathing) will be monitored. The insertion site will also be monitored for bleeding.   You will need to lie still for 4-6 hours. This is to ensure you do not bleed from the catheter insertion site.  Document Released: 02/27/2009 Document Revised: 02/25/2014 Document Reviewed: 03/05/2013 [facility] Patient Information 2015 [place], [place]. This information is not intended to replace advice given to you by your health care provider. Make sure you discuss any questions you have with your health care provider.

## 2014-08-28 ENCOUNTER — Telehealth: Payer: Self-pay | Admitting: Cardiology

## 2014-08-28 NOTE — Telephone Encounter (Signed)
New Message        Pt calling to speak to Arcola Jansky RN in regards to Cardiac Ablation, pt states she is available 10/02/14. Please call pt back to advise.

## 2014-08-28 NOTE — Telephone Encounter (Signed)
Calling wanting to schedule her ablation.  States 12/9 would be good for her.  Advised Claiborne Billings not in office today but will forward message to her to follow up.

## 2014-08-29 NOTE — Telephone Encounter (Signed)
Spoke with patient and she is aware of ablation time on 12/9 and labs on 12/2

## 2014-09-11 ENCOUNTER — Ambulatory Visit: Payer: Self-pay | Admitting: Cardiology

## 2014-09-13 NOTE — Telephone Encounter (Signed)
Follow up     Patient calling saying someone from hospital saying her ablation is now schedule  12/11 . Please advise what date / time suppose to be there.

## 2014-09-13 NOTE — Telephone Encounter (Signed)
The pt states that someone called her from the hosp to tell her that her ablation has been re-scheduled to 12/11.  She wants to know why it was re-scheduled and what time she is to arrive at the hosp on 12/11. I called and spoke with Crystal at the EP lab and she said that Dr Lovena Le will not be in the lab on 12/9 but he will be on 12/11 and that the pt needs to arrive at 5:30. The pt is advised, she verbalized understanding and is in agreement with plan. She states that she is coming to the office on 12/2 for pre procedure labs and to get Ablation instructions from [facility], Dr Tanna Furry nurse. The pt is aware that I am forwarding this message to [facility] as FYI.

## 2014-09-16 ENCOUNTER — Telehealth: Payer: Self-pay | Admitting: Internal Medicine

## 2014-09-16 NOTE — Telephone Encounter (Signed)
Spoke with patient and she is wanting to know if Dr Lovena Le could do her SVT on 12/2 as this would be better for her son as we have had a schedule change

## 2014-09-16 NOTE — Telephone Encounter (Signed)
New message          Pt would like for kelly to give her a call

## 2014-09-17 NOTE — Telephone Encounter (Signed)
Spoke with patient and she is going to check with her son in regards to date.  She is now saying 09/30/14.  She has gotten her dates mixed up but I think we have gotten this straightened out for her and she will call me back with confirmation.  She is still coming on 12/2 for her pre-op

## 2014-09-17 NOTE — Telephone Encounter (Signed)
F/u   Pt want to speak to [facility]

## 2014-09-17 NOTE — Telephone Encounter (Signed)
F/u   Pt need to speak back with nurse concerning procedure.

## 2014-09-17 NOTE — Telephone Encounter (Signed)
Spoke with patient 09/30/14 will work.  She will keep her labs on 12/2

## 2014-09-25 ENCOUNTER — Other Ambulatory Visit (INDEPENDENT_AMBULATORY_CARE_PROVIDER_SITE_OTHER): Payer: Medicare Other | Admitting: *Deleted

## 2014-09-25 ENCOUNTER — Other Ambulatory Visit: Payer: Self-pay | Admitting: *Deleted

## 2014-09-25 DIAGNOSIS — I471 Supraventricular tachycardia: Secondary | ICD-10-CM

## 2014-09-25 LAB — CBC WITH DIFFERENTIAL/PLATELET
Basophils Absolute: 0.1 10*3/uL (ref 0.0–0.1)
Basophils Relative: 1.2 % (ref 0.0–3.0)
EOS ABS: 0.2 10*3/uL (ref 0.0–0.7)
EOS PCT: 2.8 % (ref 0.0–5.0)
HCT: 40.6 % (ref 36.0–46.0)
HEMOGLOBIN: 13.3 g/dL (ref 12.0–15.0)
Lymphocytes Relative: 17.9 % (ref 12.0–46.0)
Lymphs Abs: 1.1 10*3/uL (ref 0.7–4.0)
MCHC: 32.7 g/dL (ref 30.0–36.0)
MCV: 87.5 fl (ref 78.0–100.0)
MONO ABS: 0.7 10*3/uL (ref 0.1–1.0)
MONOS PCT: 11.8 % (ref 3.0–12.0)
NEUTROS ABS: 4.1 10*3/uL (ref 1.4–7.7)
Neutrophils Relative %: 66.3 % (ref 43.0–77.0)
PLATELETS: 256 10*3/uL (ref 150.0–400.0)
RBC: 4.64 Mil/uL (ref 3.87–5.11)
RDW: 14.4 % (ref 11.5–15.5)
WBC: 6.1 10*3/uL (ref 4.0–10.5)

## 2014-09-25 NOTE — Telephone Encounter (Signed)
Spoke with patient and gave her all her pre-procedure instructions.

## 2014-09-25 NOTE — Telephone Encounter (Signed)
Follow up      Pt is having an ablation Monday---she want to talk to someone to get directions and what medications to take., etc

## 2014-09-26 LAB — BASIC METABOLIC PANEL
BUN: 13 mg/dL (ref 6–23)
CALCIUM: 9 mg/dL (ref 8.4–10.5)
CHLORIDE: 104 meq/L (ref 96–112)
CO2: 24 meq/L (ref 19–32)
CREATININE: 0.8 mg/dL (ref 0.4–1.2)
GFR: 78.79 mL/min (ref >60.00)
Glucose, Bld: 167 mg/dL — ABNORMAL HIGH (ref 70–99)
POTASSIUM: 3.8 meq/L (ref 3.5–5.1)
SODIUM: 138 meq/L (ref 135–145)

## 2014-09-30 ENCOUNTER — Ambulatory Visit (HOSPITAL_COMMUNITY)
Admission: RE | Admit: 2014-09-30 | Discharge: 2014-09-30 | Disposition: A | Discharge status: Home / Self Care | Payer: Medicare Other | Source: Ambulatory Visit | Attending: Internal Medicine | Admitting: Internal Medicine

## 2014-09-30 ENCOUNTER — Encounter (HOSPITAL_COMMUNITY): Payer: Self-pay | Admitting: Physician Assistant

## 2014-09-30 ENCOUNTER — Encounter (HOSPITAL_COMMUNITY)
Admission: RE | Disposition: A | Discharge status: Home / Self Care | Payer: Self-pay | Source: Ambulatory Visit | Attending: Internal Medicine

## 2014-09-30 DIAGNOSIS — C189 Malignant neoplasm of colon, unspecified: Secondary | ICD-10-CM | POA: Diagnosis present

## 2014-09-30 DIAGNOSIS — Z85038 Personal history of other malignant neoplasm of large intestine: Secondary | ICD-10-CM | POA: Diagnosis not present

## 2014-09-30 DIAGNOSIS — I4719 Other supraventricular tachycardia: Secondary | ICD-10-CM | POA: Diagnosis present

## 2014-09-30 DIAGNOSIS — E039 Hypothyroidism, unspecified: Secondary | ICD-10-CM | POA: Insufficient documentation

## 2014-09-30 DIAGNOSIS — N189 Chronic kidney disease, unspecified: Secondary | ICD-10-CM | POA: Insufficient documentation

## 2014-09-30 DIAGNOSIS — I10 Essential (primary) hypertension: Secondary | ICD-10-CM | POA: Diagnosis present

## 2014-09-30 DIAGNOSIS — Z9049 Acquired absence of other specified parts of digestive tract: Secondary | ICD-10-CM | POA: Diagnosis not present

## 2014-09-30 DIAGNOSIS — I129 Hypertensive chronic kidney disease with stage 1 through stage 4 chronic kidney disease, or unspecified chronic kidney disease: Secondary | ICD-10-CM | POA: Diagnosis not present

## 2014-09-30 DIAGNOSIS — E119 Type 2 diabetes mellitus without complications: Secondary | ICD-10-CM | POA: Insufficient documentation

## 2014-09-30 DIAGNOSIS — E785 Hyperlipidemia, unspecified: Secondary | ICD-10-CM | POA: Diagnosis not present

## 2014-09-30 DIAGNOSIS — I471 Supraventricular tachycardia: Secondary | ICD-10-CM | POA: Diagnosis not present

## 2014-09-30 DIAGNOSIS — Z7982 Long term (current) use of aspirin: Secondary | ICD-10-CM | POA: Insufficient documentation

## 2014-09-30 HISTORY — DX: Other supraventricular tachycardia: I47.19

## 2014-09-30 HISTORY — PX: SUPRAVENTRICULAR TACHYCARDIA ABLATION: SHX5492

## 2014-09-30 HISTORY — DX: Supraventricular tachycardia: I47.1

## 2014-09-30 LAB — GLUCOSE, CAPILLARY
Glucose-Capillary: 137 mg/dL — ABNORMAL HIGH (ref 70–99)
Glucose-Capillary: 121 mg/dL — ABNORMAL HIGH (ref 70–99)
Glucose-Capillary: 92 mg/dL (ref 70–99)

## 2014-09-30 SURGERY — SUPRAVENTRICULAR TACHYCARDIA ABLATION
Anesthesia: LOCAL

## 2014-09-30 MED ORDER — PANTOPRAZOLE SODIUM 40 MG PO TBEC
40.0000 mg | DELAYED_RELEASE_TABLET | Freq: Every day | ORAL | Status: DC
  Administered 2014-09-30: 40 mg via ORAL
  Filled 2014-09-30: qty 1

## 2014-09-30 MED ORDER — GLIMEPIRIDE 4 MG PO TABS
4.0000 mg | ORAL_TABLET | Freq: Two times a day (BID) | ORAL | Status: DC
  Administered 2014-09-30: 4 mg via ORAL
  Filled 2014-09-30: qty 1

## 2014-09-30 MED ORDER — ONDANSETRON HCL 4 MG/2ML IJ SOLN: 4.0000 mg | Freq: Four times a day (QID) | INTRAMUSCULAR | Status: DC | PRN

## 2014-09-30 MED ORDER — SODIUM CHLORIDE 0.9 % IJ SOLN: 3.0000 mL | Freq: Two times a day (BID) | INTRAMUSCULAR | Status: DC

## 2014-09-30 MED ORDER — SODIUM CHLORIDE 0.9 % IJ SOLN: 3.0000 mL | INTRAMUSCULAR | Status: DC | PRN

## 2014-09-30 MED ORDER — SODIUM CHLORIDE 0.9 % IV SOLN: 250.0000 mL | INTRAVENOUS | Status: DC | PRN

## 2014-09-30 MED ORDER — FLUTICASONE PROPIONATE 50 MCG/ACT NA SUSP: 2.0000 | Freq: Every day | NASAL | Status: DC | PRN

## 2014-09-30 MED ORDER — FENTANYL CITRATE 0.05 MG/ML IJ SOLN
INTRAMUSCULAR | Status: AC
  Filled 2014-09-30: qty 2

## 2014-09-30 MED ORDER — MIDAZOLAM HCL 5 MG/5ML IJ SOLN
INTRAMUSCULAR | Status: AC
  Filled 2014-09-30: qty 5

## 2014-09-30 MED ORDER — BUPIVACAINE HCL (PF) 0.25 % IJ SOLN
INTRAMUSCULAR | Status: AC
  Filled 2014-09-30: qty 60

## 2014-09-30 MED ORDER — SODIUM CHLORIDE 0.9 % IV SOLN
INTRAVENOUS | Status: DC
  Administered 2014-09-30: 07:00:00 via INTRAVENOUS

## 2014-09-30 MED ORDER — METOPROLOL SUCCINATE ER 50 MG PO TB24: 50.0000 mg | ORAL_TABLET | Freq: Two times a day (BID) | ORAL | Status: DC

## 2014-09-30 MED ORDER — LEVOTHYROXINE SODIUM 112 MCG PO TABS: 112.0000 ug | ORAL_TABLET | Freq: Every day | ORAL | Status: DC

## 2014-09-30 MED ORDER — PRAVASTATIN SODIUM 40 MG PO TABS
80.0000 mg | ORAL_TABLET | Freq: Every day | ORAL | Status: DC
  Administered 2014-09-30: 80 mg via ORAL
  Filled 2014-09-30: qty 2

## 2014-09-30 MED ORDER — ACETAMINOPHEN 325 MG PO TABS
650.0000 mg | ORAL_TABLET | ORAL | Status: DC | PRN
  Administered 2014-09-30: 650 mg via ORAL
  Filled 2014-09-30: qty 2

## 2014-09-30 NOTE — Progress Notes (Signed)
Site area: rt IJ Site Prior to Removal:  Level 0 Pressure Applied For: 10 minutes Manual:   yes Patient Status During Pull:  stable Post Pull Site:  Level 0 Post Pull Instructions Given:  yes Post Pull Pulses Present: NA Dressing Applied:  tegaderm Bedrest begins @ -- Comments: no complications

## 2014-09-30 NOTE — Progress Notes (Signed)
UR Completed Tory Septer Graves-Bigelow, RN,BSN [phone]  

## 2014-09-30 NOTE — CV Procedure (Signed)
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 After the usual preparation and draping, intravenous Versed and fentanyl was used for sedation. A 6 French hexapolar catheter was inserted percutaneously into the right jugular vein and advanced to the coronary sinus. A 6 French quadripolar catheter was inserted percutaneously into the right femoral vein and advanced to the right ventricle. A 6 French quadripolar catheter was inserted percutaneously into the right femoral vein and advanced to the his bundle region. After measurement of the basic intervals, rapid ventricular pacing was carried out from the right ventricle, and stepwise decreased down to 300 ms where VA block was demonstrated. During rapid ventricular pacing, the atrial activation was midline and decremental. Next, programmed ventricular stimulation was carried out from the right ventricle at a pacing cycle length of 500 ms. The S1-S2 interval was stepwise decreased from 440 ms down to 240 ms, where ventricular refractoriness was observed. During programmed ventricular stimulation, atrial activation was midline and decremental. Next programmed atrial stimulation was carried out from the coronary sinus the patient cycle length of 500 ms. The S1-S2 interval was stepwise decreased from 440 ms down to 250 ms were atrial refractoriness was observed. During programmed atrial stimulation, there was no AH jump and no echo beats. There was no inducible SVT. Next, rapid atrial pacing was carried out  from the atraumatic patient cycle length of 500 ms. The patient interval was then decreased down to 290 ms, resulting in the induction of SVT. This was a narrow QRS tachycardia. The VA interval was short, less than 60 ms. PVCs were placed at the time of his bundle refractoriness, and did not pre-excited the atrium. Ventricular pacing during tachycardia resulted in AV AV conduction sequence. A diagnosis of AV node reentrant tachycardia was made. The cycle length was 320 ms.  Mapping of the patient's region between the tricuspid valve annulus, the AV node, and the coronary sinus was carried out. This demonstrated a large coronary sinus, and the space between the coronary sinus and the tricuspid valve was small. Mapping demonstrated a small area where there was a small local atrial signal and a large local ventricular signal on the ablation catheter. 3 RF energy applications were delivered. Accelerated junctional rhythm was seen. There was no VA block  Following the sixth radiofrequency energy application, pacing was carried out from the coronary sinus. The PR interval was no longer greater than the RR interval, and there is no inducible SVT. In addition, there is no other inducible arrhythmias. The patient was observed for approximately 30 minutes. Additional pacing was carried out demonstrating no inducible SVT. AV block was now demonstrated at 300 ms. At this point the catheters were removed and the patient was returned to the recovery area for removal of her sheaths.  Complications: There were no immediate procedure complications  Results. 1. Baseline ECG. The baseline ECG demonstrated normal sinus rhythm with no ventricular preexcitation. 2. Baseline intervals. The sinus node cycle length was 762 ms. The QRS duration was 87 ms. The HV interval was 56 ms, and the AH interval was 91 ms. 3. Rapid ventricular pacing. Rapid ventricular pacing was carried out from the right  ventricle demonstrating VA block at a  cycle length of 300 ms. During rapid ventricular pacing, the atrial activation was midline and decremental. 4. Programmed ventricular stimulation. Programmed ventricular stimulation was carried out from the right ventricle and the patient cycle length of 500 ms. The S1-S2 interval was stepwise decreased from 440 ms down to 250 ms, where ventricular refractoriness was observed. During programmed ventricular stimulation, the atrial activation was midline and decremental. 5. Rapid atrial pacing. Rapid atrial pacing was carried out from the coronary sinus and the patient cycle length of 500 ms, and stepwise decreased down to 290 ms, resulting in the induction of AV node reentrant tachycardia. Following catheter ablation, AV block was demonstrated at a pacing cycle length of 390 ms, and there was no inducible SVT. 6. Programmed atrial stimulation. Programmed atrial stimulation was carried out from the coronary sinus at a pacing cycle length of 500 ms. The S1-S2 interval was stepwise decreased from 440 ms, down to 250 ms. During programmed atrial stimulation, there is no inducible SVT. 7. Arrhythmias observed. AV node reentrant tachycardia, induced with rapid atrial pacing at 390 ms from the coronary sinus, terminated with rapid atrial pacing at 250 ms, cycle length of the tachycardia was 320 ms. 8. Mapping. Mapping of the tachycardia demonstrated a very short VA interval, and midline atrial activation. 9. Radiofrequency energy application. 3 radiofrequency energy applications were delivered to the region fairly close to the AV node. During radiofrequency energy application, accelerated junctional rhythm was seen with intermittent VA block, resulting in immediate discontinuation of radiofrequency energy.  Conclusion. Successful catheter ablation of AV node reentrant tachycardia with 3 our energy applications delivered to the region of the slow pathway which was close to the patient's AV node. Following ablation,  there was no inducible SVT  Gregg Taylor,M.D.

## 2014-09-30 NOTE — Progress Notes (Signed)
Report received from [place]. Patient resting comfortably in bed at this time. VSS. Rt IJ and Rt Femoral Access sites WNL. Patient states no pain at this time. Will continue to monitor patient.

## 2014-09-30 NOTE — Progress Notes (Signed)
Report called to [place] by [facility]. Patient resting comfortably.Rt IJ and Rt femoral site WNL. Patient transferring to [phone]

## 2014-09-30 NOTE — Progress Notes (Signed)
Patient voided 326ml of clear yellow urine before leaving holding area.

## 2014-09-30 NOTE — Discharge Summary (Signed)
Discharge Summary   Patient ID: [name] MRN: [id number], DOB/AGE: [birth year] 75 y.o. Admit date: 09/30/2014 D/C date:     09/30/2014  Primary Cardiologist: Dr. Johnsie Cancel Dr. Lovena Le  Principal Problem:   AVNRT (AV nodal re-entry tachycardia) Active Problems:   HTN (hypertension)   Hypertension   Hyperlipidemia   Type 2 diabetes mellitus   Hypothyroidism   Colon cancer   Admission Dates: 09/30/14-09/30/14 Discharge Diagnosis: AVNRT s/p EP study and successful catheter ablation on 09/30/14  HPI: [name] is a 73 y.o. female with a history of CRF, HTN, HLD, and uncontrolled DM who was recently evaluated in the office for recurrent SVT. She presented to [facility] on 09/30/14 for planned EP study and catheter ablation.   She is on metoprolol and diltiazem for management of the spells and can usually break the rapid rhythm with an extra metoprolol and valsalva maneuvers. Over the last few months, she has had more episodes,which pt contributed to use of actos and since then had her physician stop this drug. Her last episode was last week at an urgent care facility in [place], per pt, with a elevated heart rate of 180 bpm. She got scared when she was loaded on the stretcher to go to the hospital and rhythm broke spontaneously. She states the tachycardia comes on and stops very abruptly and she feels as she may pass out. She currently avoids all caffeine and alcohol.   Hospital Course  AVNRT-  S/p successful catheter ablation of AVNRT. Following ablation, there was no inducible SVT -- 2D ECHO on 08/22/14 with EF 65-70%, no RWMA and G1DD. --Will discontinue Toporol XL 50mg . She will continue on Diltiazem XR 240mg  for now   HTN - her blood pressure is well controlled. --  She will continue on Diltiazem XR 240mg  for now .  Dyslipidemia - she will continue statin. Her cholesterol will be followed by her primary MD.   The patient has had an uncomplicated hospital course and is  recovering well. The femoral catheter site is stable. She has been seen by Dr. Lovena Le today and deemed ready for discharge home. All follow-up appointments have been scheduled.  Discharge medications are listed below.   Discharge Vitals: Blood pressure 139/76, pulse 76, temperature 98.9 F (37.2 C), temperature source Oral, resp. rate 15, height 5\' 7"  (1.702 m), weight 230 lb (104.327 kg), SpO2 95 %.  Labs: Lab Results  Component Value Date   WBC 6.1 09/25/2014   HGB 13.3 09/25/2014   HCT 40.6 09/25/2014   MCV 87.5 09/25/2014   PLT 256.0 09/25/2014     Recent Labs Lab 09/25/14 1017  NA 138  K 3.8  CL 104  CO2 24  BUN 13  CREATININE 0.8  CALCIUM 9.0  GLUCOSE 167*     Diagnostic Studies/Procedures   2d ECHO Study Date: 08/22/2014 LV EF: 65% -  70% ----------------------------------------------------------------- Study Conclusions - Left ventricle: The cavity size was normal. There was moderate focal basal and mild concentric hypertrophy of the left ventricle. Systolic function was vigorous. The estimated ejection fraction was in the range of 65% to 70%. Wall motion was normal; there were no regional wall motion abnormalities. Doppler parameters are consistent with abnormal left ventricular relaxation (grade 1 diastolic dysfunction). - Aortic valve: Trileaflet; mildly thickened, mildly calcified leaflets. Transvalvular velocity was within the normal range. There was no stenosis. There was no regurgitation. - Aortic root: The aortic root was normal in size. - Mitral valve: Calcified annulus.  There was no regurgitation. - Left atrium: The atrium was normal in size. - Right ventricle: Systolic function was normal. - Right atrium: The atrium was normal in size. - Tricuspid valve: There was trivial regurgitation. - Pulmonic valve: There was no regurgitation. - Pulmonary arteries: Systolic pressure was within the normal range. - Pericardium,  extracardiac: There was no pericardial effusion. Impressions: - Normal biventricular size and function. There was moderate focal basal and mild concentric hypertrophy of the left ventricle. Abnormal relaxation with minimally elevated filling pressures. No significant valvular abnormality.     09/30/14 Electrophysiology procedure note  Procedure: Invasive electrophysiologic study and catheter ablation of AV node reentrant tachycardia  Indication: Symptomatic SVT, refractory to medical therapy  Preprocedure diagnoses: Symptomatic SVT refractory to medical therapy  Postprocedure diagnosis: AV node reentrant tachycardia status post catheter ablation  Description of the procedure: After informed consent was obtained, the patient was taken to the diagnostic electrophysiology laboratory in the fasting state. After the usual preparation and draping, intravenous Versed and fentanyl was used for sedation. A 6 French hexapolar catheter was inserted percutaneously into the right jugular vein and advanced to the coronary sinus. A 6 French quadripolar catheter was inserted percutaneously into the right femoral vein and advanced to the right ventricle. A 6 French quadripolar catheter was inserted percutaneously into the right femoral vein and advanced to the his bundle region. After measurement of the basic intervals, rapid ventricular pacing was carried out from the right ventricle, and stepwise decreased down to 300 ms where VA block was demonstrated. During rapid ventricular pacing, the atrial activation was midline and decremental. Next, programmed ventricular stimulation was carried out from the right ventricle at a pacing cycle length of 500 ms. The S1-S2 interval was stepwise decreased from 440 ms down to 240 ms, where ventricular refractoriness was observed. During programmed ventricular stimulation, atrial activation was midline and decremental. Next programmed atrial stimulation was carried out  from the coronary sinus the patient cycle length of 500 ms. The S1-S2 interval was stepwise decreased from 440 ms down to 250 ms were atrial refractoriness was observed. During programmed atrial stimulation, there was no AH jump and no echo beats. There was no inducible SVT. Next, rapid atrial pacing was carried out from the atraumatic patient cycle length of 500 ms. The patient interval was then decreased down to 290 ms, resulting in the induction of SVT. This was a narrow QRS tachycardia. The VA interval was short, less than 60 ms. PVCs were placed at the time of his bundle refractoriness, and did not pre-excited the atrium. Ventricular pacing during tachycardia resulted in AV AV conduction sequence. A diagnosis of AV node reentrant tachycardia was made. The cycle length was 320 ms.  Mapping of the patient's region between the tricuspid valve annulus, the AV node, and the coronary sinus was carried out. This demonstrated a large coronary sinus, and the space between the coronary sinus and the tricuspid valve was small. Mapping demonstrated a small area where there was a small local atrial signal and a large local ventricular signal on the ablation catheter. 3 RF energy applications were delivered. Accelerated junctional rhythm was seen. There was no VA block Following the sixth radiofrequency energy application, pacing was carried out from the coronary sinus. The PR interval was no longer greater than the RR interval, and there is no inducible SVT. In addition, there is no other inducible arrhythmias. The patient was observed for approximately 30 minutes. Additional pacing was carried out demonstrating no inducible  SVT. AV block was now demonstrated at 300 ms. At this point the catheters were removed and the patient was returned to the recovery area for removal of her sheaths.  Complications: There were no immediate procedure complications  Results. 1. Baseline ECG. The baseline ECG demonstrated normal  sinus rhythm with no ventricular preexcitation. 2. Baseline intervals. The sinus node cycle length was 762 ms. The QRS duration was 87 ms. The HV interval was 56 ms, and the AH interval was 91 ms. 3. Rapid ventricular pacing. Rapid ventricular pacing was carried out from the right ventricle demonstrating VA block at a cycle length of 300 ms. During rapid ventricular pacing, the atrial activation was midline and decremental. 4. Programmed ventricular stimulation. Programmed ventricular stimulation was carried out from the right ventricle and the patient cycle length of 500 ms. The S1-S2 interval was stepwise decreased from 440 ms down to 250 ms, where ventricular refractoriness was observed. During programmed ventricular stimulation, the atrial activation was midline and decremental. 5. Rapid atrial pacing. Rapid atrial pacing was carried out from the coronary sinus and the patient cycle length of 500 ms, and stepwise decreased down to 290 ms, resulting in the induction of AV node reentrant tachycardia. Following catheter ablation, AV block was demonstrated at a pacing cycle length of 390 ms, and there was no inducible SVT. 6. Programmed atrial stimulation. Programmed atrial stimulation was carried out from the coronary sinus at a pacing cycle length of 500 ms. The S1-S2 interval was stepwise decreased from 440 ms, down to 250 ms. During programmed atrial stimulation, there is no inducible SVT. 7. Arrhythmias observed. AV node reentrant tachycardia, induced with rapid atrial pacing at 390 ms from the coronary sinus, terminated with rapid atrial pacing at 250 ms, cycle length of the tachycardia was 320 ms. 8. Mapping. Mapping of the tachycardia demonstrated a very short VA interval, and midline atrial activation. 9. Radiofrequency energy application. 3 radiofrequency energy applications were delivered to the region fairly close to the AV node. During radiofrequency energy application, accelerated junctional  rhythm was seen with intermittent VA block, resulting in immediate discontinuation of radiofrequency energy.  Conclusion. Successful catheter ablation of AV node reentrant tachycardia with 3 our energy applications delivered to the region of the slow pathway which was close to the patient's AV node. Following ablation, there was no inducible SVT   Discharge Medications     Medication List    STOP taking these medications        metoprolol succinate 50 MG 24 hr tablet  Commonly known as:  TOPROL-XL      TAKE these medications        aspirin 81 MG tablet  Take 81 mg by mouth daily.     calcium carbonate 200 MG capsule  Take 250 mg by mouth daily.     CENTRUM SILVER PO  Take 1 tablet by mouth daily.     diltiazem 240 MG 24 hr capsule  Commonly known as:  DILACOR XR  Take 240 mg by mouth daily.     FIBER 7 PO  Take 1 tablet by mouth daily.     fluticasone 50 MCG/ACT nasal spray  Commonly known as:  FLONASE  Place 2 sprays into both nostrils daily as needed for allergies or rhinitis.     glimepiride 4 MG tablet  Commonly known as:  AMARYL  Take 4 mg by mouth 2 (two) times daily.     levothyroxine 112 MCG tablet  Commonly known as:  SYNTHROID, LEVOTHROID  Take 112 mcg by mouth daily before breakfast.     omeprazole 20 MG capsule  Commonly known as:  PRILOSEC  Take 20 mg by mouth daily.     pravastatin 80 MG tablet  Commonly known as:  PRAVACHOL  Take 80 mg by mouth daily.        Disposition   The patient will be discharged in stable condition to home.  Follow-up Information    Follow up with Cristopher Peru, MD.   Specialty:  Cardiology   Why:  The office will call you to make an appoinment., If you do not hear from them, please contact them., You should be seen within 1-2 weeks.   Contact information:   [address] [address] [phone]         Duration of Discharge Encounter: Greater than 30 minutes including physician  and PA time.  Mable Fill R PA-C 09/30/2014, 4:43 PM

## 2014-09-30 NOTE — Progress Notes (Addendum)
Site area: rt groin Site Prior to Removal:  Level 0 Pressure Applied For:15 minutes Manual:   yes Patient Status During Pull:  stable Post Pull Site:  Level 0 Post Pull Instructions Given:  yes Dressing Applied:  tegaderm Bedrest begins @ 1020 Comments: no complications; IV saline locked.

## 2014-09-30 NOTE — H&P (Signed)
[name] is a 73 y.o. female with a h/o SVT here today for EP evaluation for catheter ablation with history of episodes of SVT since 2004. She has had adenosine x 1 In past which was successful to convert back to NSR.She Is on metoprolol and diltiazem for management of the spells and can usually break the rapid rhythm with an extra metoprolol and valsalva maneuvers. Over the last few months, she has had more episodes,which pt contributed to use of actos and since then had her physician stop this drug. Her last episode was last week at an urgent care facility in [place], per pt, with a elevated heart rate of 180 bpm. EKG requested. She got scared when she was loaded on the stretcher to go to the hospital and rhythm broke spontaneously.. She states the tachycardia comes on and stops very abruptly and she feels as she may pass out. She currently avoids all caffeine and alcohol.  Today, she denies symptoms of palpitations, chest pain, shortness of breath, orthopnea, PND, lower extremity edema, dizziness, presyncope, syncope, or neurologic sequela. The patient is tolerating medications without difficulties and is otherwise without complaint today.   Past Medical History  Diagnosis Date  . Supraventricular tachycardia   . Hyperlipidemia   . Hypertension   . Ulcerative colitis     intermittent mild flares  . Type 2 diabetes mellitus   . Hypothyroidism   . Squamous cell carcinoma   . Shingles   . Colon cancer     stage 1   Past Surgical History  Procedure Laterality Date  . Colon resection  2001    Partial  . Lumbar fusion    . Colonoscopy      Current Outpatient Prescriptions  Medication Sig Dispense Refill  . aspirin 81 MG tablet Take 81 mg by mouth daily.    . calcium carbonate 200 MG capsule Take 250 mg by mouth daily.     Marland Kitchen diltiazem (DILACOR XR) 240 MG 24 hr capsule Take 240 mg by mouth daily.     . fluticasone (FLONASE) 50 MCG/ACT nasal spray Place 2 sprays into both nostrils daily as needed for allergies or rhinitis.    Marland Kitchen glimepiride (AMARYL) 4 MG tablet Take 4 mg by mouth 2 (two) times daily. 1/2 tab in the am and 1 tab in the pm    . levothyroxine (SYNTHROID, LEVOTHROID) 112 MCG tablet Take 112 mcg by mouth daily before breakfast.    . metoprolol succinate (TOPROL-XL) 50 MG 24 hr tablet Take 50 mg by mouth 2 (two) times daily. Take with or immediately following a meal.    . Misc Natural Products (FIBER 7 PO) Take by mouth.    . Multiple Vitamins-Minerals (CENTRUM SILVER PO) Take by mouth.    Marland Kitchen omeprazole (PRILOSEC) 20 MG capsule Take 20 mg by mouth daily.    . pravastatin (PRAVACHOL) 80 MG tablet Take 80 mg by mouth daily.     No current facility-administered medications for this visit.    Allergies  Allergen Reactions  . Cortisone Acetate [Cortisone] Other (See Comments)    Hyperglycemia(adverse Reaction)  . Metformin And Related Diarrhea  . Misoprostol Diarrhea    History   Social History  . Marital Status: Widowed    Spouse Name: N/A    Number of Children: N/A  . Years of Education: N/A   Occupational History  . Not on file.   Social History Main Topics  . Smoking status: Former Research scientist (life sciences)  . Smokeless tobacco:  Not on file  . Alcohol Use: No  . Drug Use: No  . Sexual Activity: Not on file   Other Topics Concern  . Not on file   Social History Narrative    Family History  Problem Relation Age of Onset  . Cancer Father   . Heart attack Mother   . Diabetes Mother   . Arrhythmia Brother   . Cancer Sister   . Diabetes Sister     ROS- All systems are reviewed and negative except as per the HPI above  Physical Exam: Filed Vitals:   08/26/14 0851  BP: 122/64  Pulse: 72  Height: 5\' 7"  (1.702 m)   Weight: 229 lb 3.2 oz (103.964 kg)    GEN- The patient is well appearing, alert and oriented x 3 today.  Head- normocephalic, atraumatic Eyes- Sclera clear, conjunctiva pink Ears- hearing intact Oropharynx- clear Neck- supple, no JVP Lymph- no cervical lymphadenopathy Lungs- Clear to ausculation bilaterally, normal work of breathing Heart- Regular rate and rhythm, no murmurs, rubs or gallops, PMI not laterally displaced GI- soft, NT, ND, + BS Extremities- no clubbing, cyanosis, or edema MS- no significant deformity or atrophy Skin- no rash or lesion Psych- euthymic mood, full affect Neuro- strength and sensation are intact  EKG- NSR with notched P wave, possible inferior/anterior infarct, age undetermined.  EKG, documenting SVT, requested from [facility]  Echo- LV function at 65-70%, moderated LVH.     Assessment and Plan:  1. AVNRT- Therapeutic strategies for EP study and catheter ablation were discussed in detail with the patient today. Risk, benefits, to EP study and radiofrequency ablation of SVT were also discussed in detail today. These risks include but are not limited to bleeding, vascular damage, tamponade, possibility of pacemaker.. The patient understands these risk and wishes to proceed.   2.HTN - her blood pressure is controlled.  3. Dyslipidemia - she will continue her medical therapy. Her cholesterol will be followed by her primary MD.   Mikle Bosworth.D

## 2014-10-01 ENCOUNTER — Telehealth: Payer: Self-pay | Admitting: Internal Medicine

## 2014-10-01 NOTE — Telephone Encounter (Signed)
New Message  Pt had ablation (12/7) and wanted to speak with Claiborne Billings. Please call back and discuss.

## 2014-10-01 NOTE — Telephone Encounter (Signed)
Spoke with patient.  She got home from the hospital yesterday and her heart began to race HR 110 after she was starteled by her doorbell.  She took a Metoprolol and it settled down.  I let her know that some racing and irregularities are not uncommon after an ablation.  She will call back if she has any more concerns

## 2014-10-02 ENCOUNTER — Telehealth: Payer: Self-pay | Admitting: Cardiology

## 2014-10-02 ENCOUNTER — Telehealth: Payer: Self-pay | Admitting: Internal Medicine

## 2014-10-02 NOTE — Telephone Encounter (Signed)
Not a Dr Marlou Porch pt.  Recent procedure by Dr Lovena Le -

## 2014-10-02 NOTE — Telephone Encounter (Signed)
New msg   Pt calling, states she had a procedure completed on Monday and has higher than normal heart rate ranging from 101-110. Please contact at [phone].

## 2014-10-02 NOTE — Telephone Encounter (Signed)
Error/disregard

## 2014-10-03 ENCOUNTER — Encounter (HOSPITAL_COMMUNITY): Payer: Self-pay | Admitting: Internal Medicine

## 2014-10-03 NOTE — Telephone Encounter (Signed)
When patient was discharged from the hospital she did not continue to take her Diltiazem 240 mg as she was instructed.  Says her discharge paper work was still in the car.  She will resume and call me back if this does not help.

## 2014-10-08 ENCOUNTER — Encounter: Payer: Self-pay | Admitting: Internal Medicine

## 2014-10-08 ENCOUNTER — Ambulatory Visit (INDEPENDENT_AMBULATORY_CARE_PROVIDER_SITE_OTHER): Payer: Medicare Other | Admitting: Internal Medicine

## 2014-10-08 VITALS — BP 128/84 | HR 89 | Ht 67.0 in | Wt 232.0 lb

## 2014-10-08 DIAGNOSIS — E785 Hyperlipidemia, unspecified: Secondary | ICD-10-CM

## 2014-10-08 DIAGNOSIS — I10 Essential (primary) hypertension: Secondary | ICD-10-CM

## 2014-10-08 DIAGNOSIS — I471 Supraventricular tachycardia: Secondary | ICD-10-CM

## 2014-10-08 NOTE — Progress Notes (Signed)
HPI [name] returns today for follow-up. She is a very pleasant 73 year old woman with a long history of tachycardia palpitations and documented SVT, who underwent catheter ablation of AV node reentrant tachycardia several weeks ago. In the interim, she has done well. Immediately post procedure, we had stopped both her beta blocker and her calcium channel blocker, and her blood pressure increased and she developed the sensation of palpitations secondary to sinus tachycardia. She was restarted on her calcium channel blocker. She has done well. She denies recurrent SVT and her blood pressure has been controlled. No other symptoms today. Allergies  Allergen Reactions  . Cortisone Acetate [Cortisone] Other (See Comments)    Hyperglycemia(adverse Reaction)  . Metformin And Related Diarrhea  . Misoprostol Diarrhea     Current Outpatient Prescriptions  Medication Sig Dispense Refill  . aspirin 81 MG tablet Take 81 mg by mouth daily.    . calcium carbonate 200 MG capsule Take 1 capsule by mouth daily.     Marland Kitchen diltiazem (DILACOR XR) 240 MG 24 hr capsule Take 240 mg by mouth daily.    . fluticasone (FLONASE) 50 MCG/ACT nasal spray Place 2 sprays into both nostrils daily as needed for allergies or rhinitis.    Marland Kitchen glimepiride (AMARYL) 4 MG tablet Take 4 mg by mouth 2 (two) times daily.     Marland Kitchen levothyroxine (SYNTHROID, LEVOTHROID) 112 MCG tablet Take 112 mcg by mouth daily before breakfast.    . Misc Natural Products (FIBER 7 PO) Take 1 tablet by mouth daily.     . Multiple Vitamins-Minerals (CENTRUM SILVER PO) Take 1 tablet by mouth daily.     Marland Kitchen omeprazole (PRILOSEC) 20 MG capsule Take 20 mg by mouth daily.    . pravastatin (PRAVACHOL) 80 MG tablet Take 80 mg by mouth daily.    . pioglitazone (ACTOS) 30 MG tablet   0   No current facility-administered medications for this visit.     Past Medical History  Diagnosis Date  . Supraventricular tachycardia   . Hyperlipidemia   .  Hypertension   . Ulcerative colitis     intermittent mild flares  . Type 2 diabetes mellitus   . Hypothyroidism   . Squamous cell carcinoma   . Shingles   . Colon cancer     stage 1  . AVNRT (AV nodal re-entry tachycardia)     a. s/p catheter ablation on 09/30/14    ROS:   All systems reviewed and negative except as noted in the HPI.   Past Surgical History  Procedure Laterality Date  . Colon resection  2001    Partial  . Lumbar fusion    . Colonoscopy    . Supraventricular tachycardia ablation N/A 09/30/2014    Procedure: SUPRAVENTRICULAR TACHYCARDIA ABLATION;  Surgeon: Evans Lance, MD;  [facility];  Service: Cardiovascular;  Laterality: N/A;     Family History  Problem Relation Age of Onset  . Cancer Father   . Heart attack Mother   . Diabetes Mother   . Arrhythmia Brother   . Cancer Sister   . Diabetes Sister      History   Social History  . Marital Status: Widowed    Spouse Name: N/A    Number of Children: N/A  . Years of Education: N/A   Occupational History  . Not on file.   Social History Main Topics  . Smoking status: Former Research scientist (life sciences)  . Smokeless tobacco: Not on file  .  Alcohol Use: No  . Drug Use: No  . Sexual Activity: Not on file   Other Topics Concern  . Not on file   Social History Narrative     BP 128/84 mmHg  Pulse 89  Ht 5\' 7"  (1.702 m)  Wt 232 lb (105.235 kg)  BMI 36.33 kg/m2  Physical Exam:  Well appearing 73 year old woman, NAD HEENT: Unremarkable Neck:  No JVD, no thyromegally Lymphatics:  No adenopathy Back:  No CVA tenderness Lungs:  Clear, with no wheezes rales or rhonchi. HEART:  Regular rate rhythm, no murmurs, no rubs, no clicks Abd:  soft, positive bowel sounds, no organomegally, no rebound, no guarding Ext:  2 plus pulses, no edema, no cyanosis, no clubbing Skin:  No rashes no nodules Neuro:  CN II through XII intact, motor grossly intact  EKG - normal sinus rhythm with occasional  PVCs.   Assess/Plan:

## 2014-10-08 NOTE — Patient Instructions (Signed)
Your physician recommends that you schedule a follow-up appointment as needed  

## 2014-10-08 NOTE — Assessment & Plan Note (Signed)
Her blood pressure is well controlled on her calcium channel blocker. She will continue this medication for the present time. I will defer switching the patient to a different type of antihypertensive medication, leaving this to her primary physician if needed. She was encouraged to lose weight, and maintain a low-sodium diet.

## 2014-10-08 NOTE — Assessment & Plan Note (Signed)
Her symptoms are well-controlled, and she has no evidence of recurrent SVT. We'll plan back on an as-needed basis. She will continue her current medications.

## 2014-10-08 NOTE — Assessment & Plan Note (Signed)
She will continue her statin therapy and maintain a low-fat diet.

## 2014-10-22 ENCOUNTER — Encounter: Payer: Medicare Other | Admitting: Internal Medicine

## 2015-12-31 DIAGNOSIS — I10 Essential (primary) hypertension: Secondary | ICD-10-CM | POA: Diagnosis not present

## 2015-12-31 DIAGNOSIS — E7800 Pure hypercholesterolemia, unspecified: Secondary | ICD-10-CM | POA: Diagnosis not present

## 2015-12-31 DIAGNOSIS — K5190 Ulcerative colitis, unspecified, without complications: Secondary | ICD-10-CM | POA: Diagnosis not present

## 2015-12-31 DIAGNOSIS — E038 Other specified hypothyroidism: Secondary | ICD-10-CM | POA: Diagnosis not present

## 2015-12-31 DIAGNOSIS — E1165 Type 2 diabetes mellitus with hyperglycemia: Secondary | ICD-10-CM | POA: Diagnosis not present

## 2016-01-06 DIAGNOSIS — L570 Actinic keratosis: Secondary | ICD-10-CM | POA: Diagnosis not present

## 2016-01-06 DIAGNOSIS — L578 Other skin changes due to chronic exposure to nonionizing radiation: Secondary | ICD-10-CM | POA: Diagnosis not present

## 2016-01-06 DIAGNOSIS — L820 Inflamed seborrheic keratosis: Secondary | ICD-10-CM | POA: Diagnosis not present

## 2016-04-01 DIAGNOSIS — E669 Obesity, unspecified: Secondary | ICD-10-CM | POA: Diagnosis not present

## 2016-04-01 DIAGNOSIS — I10 Essential (primary) hypertension: Secondary | ICD-10-CM | POA: Diagnosis not present

## 2016-04-01 DIAGNOSIS — Z79899 Other long term (current) drug therapy: Secondary | ICD-10-CM | POA: Diagnosis not present

## 2016-04-01 DIAGNOSIS — Z0000 Encounter for general adult medical examination without abnormal findings: Secondary | ICD-10-CM | POA: Diagnosis not present

## 2016-04-01 DIAGNOSIS — Z85038 Personal history of other malignant neoplasm of large intestine: Secondary | ICD-10-CM | POA: Diagnosis not present

## 2016-04-01 DIAGNOSIS — E1165 Type 2 diabetes mellitus with hyperglycemia: Secondary | ICD-10-CM | POA: Diagnosis not present

## 2016-04-01 DIAGNOSIS — E7800 Pure hypercholesterolemia, unspecified: Secondary | ICD-10-CM | POA: Diagnosis not present

## 2016-04-01 DIAGNOSIS — K219 Gastro-esophageal reflux disease without esophagitis: Secondary | ICD-10-CM | POA: Diagnosis not present

## 2016-04-01 DIAGNOSIS — R197 Diarrhea, unspecified: Secondary | ICD-10-CM | POA: Diagnosis not present

## 2016-04-01 DIAGNOSIS — Z6834 Body mass index (BMI) 34.0-34.9, adult: Secondary | ICD-10-CM | POA: Diagnosis not present

## 2016-04-01 DIAGNOSIS — M859 Disorder of bone density and structure, unspecified: Secondary | ICD-10-CM | POA: Diagnosis not present

## 2016-04-01 DIAGNOSIS — E038 Other specified hypothyroidism: Secondary | ICD-10-CM | POA: Diagnosis not present

## 2016-04-05 DIAGNOSIS — M810 Age-related osteoporosis without current pathological fracture: Secondary | ICD-10-CM | POA: Diagnosis not present

## 2016-04-05 DIAGNOSIS — M859 Disorder of bone density and structure, unspecified: Secondary | ICD-10-CM | POA: Diagnosis not present

## 2016-04-26 DIAGNOSIS — S70361A Insect bite (nonvenomous), right thigh, initial encounter: Secondary | ICD-10-CM | POA: Diagnosis not present

## 2016-05-13 DIAGNOSIS — E1165 Type 2 diabetes mellitus with hyperglycemia: Secondary | ICD-10-CM | POA: Diagnosis not present

## 2016-05-13 DIAGNOSIS — T148 Other injury of unspecified body region: Secondary | ICD-10-CM | POA: Diagnosis not present

## 2016-05-13 DIAGNOSIS — E669 Obesity, unspecified: Secondary | ICD-10-CM | POA: Diagnosis not present

## 2016-05-13 DIAGNOSIS — E119 Type 2 diabetes mellitus without complications: Secondary | ICD-10-CM | POA: Diagnosis not present

## 2016-05-13 DIAGNOSIS — S72012A Unspecified intracapsular fracture of left femur, initial encounter for closed fracture: Secondary | ICD-10-CM | POA: Diagnosis not present

## 2016-05-13 DIAGNOSIS — I471 Supraventricular tachycardia: Secondary | ICD-10-CM | POA: Diagnosis not present

## 2016-05-13 DIAGNOSIS — S72042A Displaced fracture of base of neck of left femur, initial encounter for closed fracture: Secondary | ICD-10-CM | POA: Diagnosis not present

## 2016-05-13 DIAGNOSIS — Z79899 Other long term (current) drug therapy: Secondary | ICD-10-CM | POA: Diagnosis not present

## 2016-05-13 DIAGNOSIS — E039 Hypothyroidism, unspecified: Secondary | ICD-10-CM | POA: Diagnosis not present

## 2016-05-13 DIAGNOSIS — M810 Age-related osteoporosis without current pathological fracture: Secondary | ICD-10-CM | POA: Diagnosis not present

## 2016-05-13 DIAGNOSIS — Z87891 Personal history of nicotine dependence: Secondary | ICD-10-CM | POA: Diagnosis not present

## 2016-05-13 DIAGNOSIS — S72002A Fracture of unspecified part of neck of left femur, initial encounter for closed fracture: Secondary | ICD-10-CM | POA: Diagnosis not present

## 2016-05-13 DIAGNOSIS — R0602 Shortness of breath: Secondary | ICD-10-CM | POA: Diagnosis not present

## 2016-05-13 DIAGNOSIS — I2510 Atherosclerotic heart disease of native coronary artery without angina pectoris: Secondary | ICD-10-CM | POA: Diagnosis not present

## 2016-05-13 DIAGNOSIS — F319 Bipolar disorder, unspecified: Secondary | ICD-10-CM | POA: Diagnosis not present

## 2016-05-13 DIAGNOSIS — S72092A Other fracture of head and neck of left femur, initial encounter for closed fracture: Secondary | ICD-10-CM | POA: Diagnosis not present

## 2016-05-13 DIAGNOSIS — Z7952 Long term (current) use of systemic steroids: Secondary | ICD-10-CM | POA: Diagnosis not present

## 2016-05-13 DIAGNOSIS — E785 Hyperlipidemia, unspecified: Secondary | ICD-10-CM | POA: Diagnosis not present

## 2016-05-13 DIAGNOSIS — Z7984 Long term (current) use of oral hypoglycemic drugs: Secondary | ICD-10-CM | POA: Diagnosis not present

## 2016-05-13 DIAGNOSIS — I10 Essential (primary) hypertension: Secondary | ICD-10-CM | POA: Diagnosis not present

## 2016-05-13 DIAGNOSIS — Z7982 Long term (current) use of aspirin: Secondary | ICD-10-CM | POA: Diagnosis not present

## 2016-05-13 DIAGNOSIS — J449 Chronic obstructive pulmonary disease, unspecified: Secondary | ICD-10-CM | POA: Diagnosis not present

## 2016-05-13 DIAGNOSIS — W19XXXA Unspecified fall, initial encounter: Secondary | ICD-10-CM | POA: Diagnosis not present

## 2016-05-13 DIAGNOSIS — K219 Gastro-esophageal reflux disease without esophagitis: Secondary | ICD-10-CM | POA: Diagnosis not present

## 2016-05-13 DIAGNOSIS — Z6831 Body mass index (BMI) 31.0-31.9, adult: Secondary | ICD-10-CM | POA: Diagnosis not present

## 2016-05-13 DIAGNOSIS — M25552 Pain in left hip: Secondary | ICD-10-CM | POA: Diagnosis not present

## 2016-05-14 DIAGNOSIS — S72092A Other fracture of head and neck of left femur, initial encounter for closed fracture: Secondary | ICD-10-CM | POA: Diagnosis not present

## 2016-05-14 DIAGNOSIS — J449 Chronic obstructive pulmonary disease, unspecified: Secondary | ICD-10-CM | POA: Diagnosis not present

## 2016-05-14 DIAGNOSIS — M80052A Age-related osteoporosis with current pathological fracture, left femur, initial encounter for fracture: Secondary | ICD-10-CM | POA: Diagnosis not present

## 2016-05-14 DIAGNOSIS — S72042A Displaced fracture of base of neck of left femur, initial encounter for closed fracture: Secondary | ICD-10-CM | POA: Diagnosis not present

## 2016-05-17 DIAGNOSIS — G8918 Other acute postprocedural pain: Secondary | ICD-10-CM | POA: Diagnosis not present

## 2016-05-17 DIAGNOSIS — S72002D Fracture of unspecified part of neck of left femur, subsequent encounter for closed fracture with routine healing: Secondary | ICD-10-CM | POA: Diagnosis not present

## 2016-05-17 DIAGNOSIS — E039 Hypothyroidism, unspecified: Secondary | ICD-10-CM | POA: Diagnosis not present

## 2016-05-17 DIAGNOSIS — D649 Anemia, unspecified: Secondary | ICD-10-CM | POA: Diagnosis not present

## 2016-05-17 DIAGNOSIS — M4326 Fusion of spine, lumbar region: Secondary | ICD-10-CM | POA: Diagnosis not present

## 2016-05-17 DIAGNOSIS — E119 Type 2 diabetes mellitus without complications: Secondary | ICD-10-CM | POA: Diagnosis not present

## 2016-05-17 DIAGNOSIS — M439 Deforming dorsopathy, unspecified: Secondary | ICD-10-CM | POA: Diagnosis not present

## 2016-05-17 DIAGNOSIS — K219 Gastro-esophageal reflux disease without esophagitis: Secondary | ICD-10-CM | POA: Diagnosis not present

## 2016-05-17 DIAGNOSIS — I471 Supraventricular tachycardia: Secondary | ICD-10-CM | POA: Diagnosis not present

## 2016-05-17 DIAGNOSIS — E1165 Type 2 diabetes mellitus with hyperglycemia: Secondary | ICD-10-CM | POA: Diagnosis not present

## 2016-05-17 DIAGNOSIS — Z794 Long term (current) use of insulin: Secondary | ICD-10-CM | POA: Diagnosis not present

## 2016-05-18 DIAGNOSIS — E1165 Type 2 diabetes mellitus with hyperglycemia: Secondary | ICD-10-CM | POA: Diagnosis not present

## 2016-05-18 DIAGNOSIS — M439 Deforming dorsopathy, unspecified: Secondary | ICD-10-CM | POA: Diagnosis not present

## 2016-05-18 DIAGNOSIS — G8918 Other acute postprocedural pain: Secondary | ICD-10-CM | POA: Diagnosis not present

## 2016-05-18 DIAGNOSIS — D649 Anemia, unspecified: Secondary | ICD-10-CM | POA: Diagnosis not present

## 2016-05-27 DIAGNOSIS — S72002A Fracture of unspecified part of neck of left femur, initial encounter for closed fracture: Secondary | ICD-10-CM | POA: Diagnosis not present

## 2016-05-29 DIAGNOSIS — Z7984 Long term (current) use of oral hypoglycemic drugs: Secondary | ICD-10-CM | POA: Diagnosis not present

## 2016-05-29 DIAGNOSIS — I10 Essential (primary) hypertension: Secondary | ICD-10-CM | POA: Diagnosis not present

## 2016-05-29 DIAGNOSIS — Z9181 History of falling: Secondary | ICD-10-CM | POA: Diagnosis not present

## 2016-05-29 DIAGNOSIS — Z7982 Long term (current) use of aspirin: Secondary | ICD-10-CM | POA: Diagnosis not present

## 2016-05-29 DIAGNOSIS — Z602 Problems related to living alone: Secondary | ICD-10-CM | POA: Diagnosis not present

## 2016-05-29 DIAGNOSIS — E119 Type 2 diabetes mellitus without complications: Secondary | ICD-10-CM | POA: Diagnosis not present

## 2016-05-29 DIAGNOSIS — Z800 Family history of malignant neoplasm of digestive organs: Secondary | ICD-10-CM | POA: Diagnosis not present

## 2016-05-29 DIAGNOSIS — M80052D Age-related osteoporosis with current pathological fracture, left femur, subsequent encounter for fracture with routine healing: Secondary | ICD-10-CM | POA: Diagnosis not present

## 2016-05-29 DIAGNOSIS — Z7901 Long term (current) use of anticoagulants: Secondary | ICD-10-CM | POA: Diagnosis not present

## 2016-06-08 DIAGNOSIS — E119 Type 2 diabetes mellitus without complications: Secondary | ICD-10-CM | POA: Diagnosis not present

## 2016-06-08 DIAGNOSIS — M80052D Age-related osteoporosis with current pathological fracture, left femur, subsequent encounter for fracture with routine healing: Secondary | ICD-10-CM | POA: Diagnosis not present

## 2016-06-08 DIAGNOSIS — I10 Essential (primary) hypertension: Secondary | ICD-10-CM | POA: Diagnosis not present

## 2016-06-08 DIAGNOSIS — Z7901 Long term (current) use of anticoagulants: Secondary | ICD-10-CM | POA: Diagnosis not present

## 2016-06-30 DIAGNOSIS — S72002D Fracture of unspecified part of neck of left femur, subsequent encounter for closed fracture with routine healing: Secondary | ICD-10-CM | POA: Diagnosis not present

## 2016-07-01 DIAGNOSIS — S72002A Fracture of unspecified part of neck of left femur, initial encounter for closed fracture: Secondary | ICD-10-CM | POA: Diagnosis not present

## 2016-07-06 DIAGNOSIS — F330 Major depressive disorder, recurrent, mild: Secondary | ICD-10-CM | POA: Diagnosis not present

## 2016-07-06 DIAGNOSIS — E1165 Type 2 diabetes mellitus with hyperglycemia: Secondary | ICD-10-CM | POA: Diagnosis not present

## 2016-07-06 DIAGNOSIS — I10 Essential (primary) hypertension: Secondary | ICD-10-CM | POA: Diagnosis not present

## 2016-07-23 DIAGNOSIS — M1712 Unilateral primary osteoarthritis, left knee: Secondary | ICD-10-CM | POA: Diagnosis not present

## 2016-07-23 DIAGNOSIS — M1711 Unilateral primary osteoarthritis, right knee: Secondary | ICD-10-CM | POA: Diagnosis not present

## 2016-07-23 DIAGNOSIS — M170 Bilateral primary osteoarthritis of knee: Secondary | ICD-10-CM | POA: Diagnosis not present

## 2016-08-02 DIAGNOSIS — M1711 Unilateral primary osteoarthritis, right knee: Secondary | ICD-10-CM | POA: Diagnosis not present

## 2016-08-12 DIAGNOSIS — S72002A Fracture of unspecified part of neck of left femur, initial encounter for closed fracture: Secondary | ICD-10-CM | POA: Diagnosis not present

## 2016-09-01 DIAGNOSIS — Z23 Encounter for immunization: Secondary | ICD-10-CM | POA: Diagnosis not present

## 2016-09-08 DIAGNOSIS — H2513 Age-related nuclear cataract, bilateral: Secondary | ICD-10-CM | POA: Diagnosis not present

## 2016-09-13 DIAGNOSIS — E1165 Type 2 diabetes mellitus with hyperglycemia: Secondary | ICD-10-CM | POA: Diagnosis not present

## 2016-09-27 DIAGNOSIS — M172 Bilateral post-traumatic osteoarthritis of knee: Secondary | ICD-10-CM | POA: Diagnosis not present

## 2016-10-04 DIAGNOSIS — M172 Bilateral post-traumatic osteoarthritis of knee: Secondary | ICD-10-CM | POA: Diagnosis not present

## 2016-10-05 DIAGNOSIS — E1165 Type 2 diabetes mellitus with hyperglycemia: Secondary | ICD-10-CM | POA: Diagnosis not present

## 2016-10-05 DIAGNOSIS — E7800 Pure hypercholesterolemia, unspecified: Secondary | ICD-10-CM | POA: Diagnosis not present

## 2016-10-05 DIAGNOSIS — F330 Major depressive disorder, recurrent, mild: Secondary | ICD-10-CM | POA: Diagnosis not present

## 2016-10-05 DIAGNOSIS — E1169 Type 2 diabetes mellitus with other specified complication: Secondary | ICD-10-CM | POA: Diagnosis not present

## 2016-10-05 DIAGNOSIS — E038 Other specified hypothyroidism: Secondary | ICD-10-CM | POA: Diagnosis not present

## 2016-10-05 DIAGNOSIS — I10 Essential (primary) hypertension: Secondary | ICD-10-CM | POA: Diagnosis not present

## 2016-10-27 DIAGNOSIS — Z1231 Encounter for screening mammogram for malignant neoplasm of breast: Secondary | ICD-10-CM | POA: Diagnosis not present

## 2016-11-15 DIAGNOSIS — R922 Inconclusive mammogram: Secondary | ICD-10-CM | POA: Diagnosis not present

## 2016-11-15 DIAGNOSIS — R928 Other abnormal and inconclusive findings on diagnostic imaging of breast: Secondary | ICD-10-CM | POA: Diagnosis not present

## 2016-11-15 DIAGNOSIS — S72002A Fracture of unspecified part of neck of left femur, initial encounter for closed fracture: Secondary | ICD-10-CM | POA: Diagnosis not present

## 2017-01-04 DIAGNOSIS — E038 Other specified hypothyroidism: Secondary | ICD-10-CM | POA: Diagnosis not present

## 2017-01-04 DIAGNOSIS — E11311 Type 2 diabetes mellitus with unspecified diabetic retinopathy with macular edema: Secondary | ICD-10-CM | POA: Diagnosis not present

## 2017-01-04 DIAGNOSIS — K219 Gastro-esophageal reflux disease without esophagitis: Secondary | ICD-10-CM | POA: Diagnosis not present

## 2017-01-04 DIAGNOSIS — I10 Essential (primary) hypertension: Secondary | ICD-10-CM | POA: Diagnosis not present

## 2017-01-04 DIAGNOSIS — E7800 Pure hypercholesterolemia, unspecified: Secondary | ICD-10-CM | POA: Diagnosis not present

## 2017-01-05 DIAGNOSIS — L570 Actinic keratosis: Secondary | ICD-10-CM | POA: Diagnosis not present

## 2017-01-05 DIAGNOSIS — L821 Other seborrheic keratosis: Secondary | ICD-10-CM | POA: Diagnosis not present

## 2017-01-05 DIAGNOSIS — L578 Other skin changes due to chronic exposure to nonionizing radiation: Secondary | ICD-10-CM | POA: Diagnosis not present

## 2017-01-18 DIAGNOSIS — M1712 Unilateral primary osteoarthritis, left knee: Secondary | ICD-10-CM | POA: Diagnosis not present

## 2017-01-27 DIAGNOSIS — M1711 Unilateral primary osteoarthritis, right knee: Secondary | ICD-10-CM | POA: Diagnosis not present

## 2017-04-06 DIAGNOSIS — Z6835 Body mass index (BMI) 35.0-35.9, adult: Secondary | ICD-10-CM | POA: Diagnosis not present

## 2017-04-06 DIAGNOSIS — I10 Essential (primary) hypertension: Secondary | ICD-10-CM | POA: Diagnosis not present

## 2017-04-06 DIAGNOSIS — Z85038 Personal history of other malignant neoplasm of large intestine: Secondary | ICD-10-CM | POA: Diagnosis not present

## 2017-04-06 DIAGNOSIS — E7800 Pure hypercholesterolemia, unspecified: Secondary | ICD-10-CM | POA: Diagnosis not present

## 2017-04-06 DIAGNOSIS — Z79899 Other long term (current) drug therapy: Secondary | ICD-10-CM | POA: Diagnosis not present

## 2017-04-06 DIAGNOSIS — E038 Other specified hypothyroidism: Secondary | ICD-10-CM | POA: Diagnosis not present

## 2017-04-06 DIAGNOSIS — E119 Type 2 diabetes mellitus without complications: Secondary | ICD-10-CM | POA: Diagnosis not present

## 2017-07-22 DIAGNOSIS — Z79899 Other long term (current) drug therapy: Secondary | ICD-10-CM | POA: Diagnosis not present

## 2017-07-22 DIAGNOSIS — Z0000 Encounter for general adult medical examination without abnormal findings: Secondary | ICD-10-CM | POA: Diagnosis not present

## 2017-07-22 DIAGNOSIS — I10 Essential (primary) hypertension: Secondary | ICD-10-CM | POA: Diagnosis not present

## 2017-07-22 DIAGNOSIS — E7800 Pure hypercholesterolemia, unspecified: Secondary | ICD-10-CM | POA: Diagnosis not present

## 2017-07-22 DIAGNOSIS — Z6834 Body mass index (BMI) 34.0-34.9, adult: Secondary | ICD-10-CM | POA: Diagnosis not present

## 2017-07-22 DIAGNOSIS — E038 Other specified hypothyroidism: Secondary | ICD-10-CM | POA: Diagnosis not present

## 2017-07-22 DIAGNOSIS — E119 Type 2 diabetes mellitus without complications: Secondary | ICD-10-CM | POA: Diagnosis not present

## 2017-07-22 DIAGNOSIS — Z23 Encounter for immunization: Secondary | ICD-10-CM | POA: Diagnosis not present

## 2017-07-29 DIAGNOSIS — M1712 Unilateral primary osteoarthritis, left knee: Secondary | ICD-10-CM | POA: Diagnosis not present

## 2017-08-08 DIAGNOSIS — M1712 Unilateral primary osteoarthritis, left knee: Secondary | ICD-10-CM | POA: Diagnosis not present

## 2017-08-10 DIAGNOSIS — L821 Other seborrheic keratosis: Secondary | ICD-10-CM | POA: Diagnosis not present

## 2017-08-10 DIAGNOSIS — L578 Other skin changes due to chronic exposure to nonionizing radiation: Secondary | ICD-10-CM | POA: Diagnosis not present

## 2017-08-10 DIAGNOSIS — L728 Other follicular cysts of the skin and subcutaneous tissue: Secondary | ICD-10-CM | POA: Diagnosis not present

## 2017-08-10 DIAGNOSIS — L570 Actinic keratosis: Secondary | ICD-10-CM | POA: Diagnosis not present

## 2017-08-17 DIAGNOSIS — R21 Rash and other nonspecific skin eruption: Secondary | ICD-10-CM | POA: Diagnosis not present

## 2017-10-28 DIAGNOSIS — I10 Essential (primary) hypertension: Secondary | ICD-10-CM | POA: Diagnosis not present

## 2017-10-28 DIAGNOSIS — Z1239 Encounter for other screening for malignant neoplasm of breast: Secondary | ICD-10-CM | POA: Diagnosis not present

## 2017-10-28 DIAGNOSIS — E7800 Pure hypercholesterolemia, unspecified: Secondary | ICD-10-CM | POA: Diagnosis not present

## 2017-10-28 DIAGNOSIS — E038 Other specified hypothyroidism: Secondary | ICD-10-CM | POA: Diagnosis not present

## 2017-10-28 DIAGNOSIS — E1165 Type 2 diabetes mellitus with hyperglycemia: Secondary | ICD-10-CM | POA: Diagnosis not present

## 2017-11-15 DIAGNOSIS — Z1231 Encounter for screening mammogram for malignant neoplasm of breast: Secondary | ICD-10-CM | POA: Diagnosis not present

## 2017-11-15 DIAGNOSIS — H25813 Combined forms of age-related cataract, bilateral: Secondary | ICD-10-CM | POA: Diagnosis not present

## 2017-11-15 DIAGNOSIS — E119 Type 2 diabetes mellitus without complications: Secondary | ICD-10-CM | POA: Diagnosis not present

## 2017-12-30 DIAGNOSIS — M1712 Unilateral primary osteoarthritis, left knee: Secondary | ICD-10-CM | POA: Diagnosis not present

## 2017-12-30 DIAGNOSIS — M170 Bilateral primary osteoarthritis of knee: Secondary | ICD-10-CM | POA: Diagnosis not present

## 2017-12-30 DIAGNOSIS — M1711 Unilateral primary osteoarthritis, right knee: Secondary | ICD-10-CM | POA: Diagnosis not present

## 2017-12-30 DIAGNOSIS — M13861 Other specified arthritis, right knee: Secondary | ICD-10-CM | POA: Diagnosis not present

## 2018-01-03 DIAGNOSIS — M1711 Unilateral primary osteoarthritis, right knee: Secondary | ICD-10-CM | POA: Diagnosis not present

## 2018-01-04 DIAGNOSIS — L570 Actinic keratosis: Secondary | ICD-10-CM | POA: Diagnosis not present

## 2018-01-04 DIAGNOSIS — L578 Other skin changes due to chronic exposure to nonionizing radiation: Secondary | ICD-10-CM | POA: Diagnosis not present

## 2018-01-04 DIAGNOSIS — L821 Other seborrheic keratosis: Secondary | ICD-10-CM | POA: Diagnosis not present

## 2018-01-04 DIAGNOSIS — L728 Other follicular cysts of the skin and subcutaneous tissue: Secondary | ICD-10-CM | POA: Diagnosis not present

## 2018-02-24 DIAGNOSIS — E7800 Pure hypercholesterolemia, unspecified: Secondary | ICD-10-CM | POA: Diagnosis not present

## 2018-02-24 DIAGNOSIS — F330 Major depressive disorder, recurrent, mild: Secondary | ICD-10-CM | POA: Diagnosis not present

## 2018-02-24 DIAGNOSIS — E1165 Type 2 diabetes mellitus with hyperglycemia: Secondary | ICD-10-CM | POA: Diagnosis not present

## 2018-02-24 DIAGNOSIS — I10 Essential (primary) hypertension: Secondary | ICD-10-CM | POA: Diagnosis not present

## 2018-02-24 DIAGNOSIS — I471 Supraventricular tachycardia: Secondary | ICD-10-CM | POA: Diagnosis not present

## 2018-02-24 DIAGNOSIS — Z6834 Body mass index (BMI) 34.0-34.9, adult: Secondary | ICD-10-CM | POA: Diagnosis not present

## 2018-02-24 DIAGNOSIS — E669 Obesity, unspecified: Secondary | ICD-10-CM | POA: Diagnosis not present

## 2018-02-24 DIAGNOSIS — E038 Other specified hypothyroidism: Secondary | ICD-10-CM | POA: Diagnosis not present

## 2018-04-24 DIAGNOSIS — M1712 Unilateral primary osteoarthritis, left knee: Secondary | ICD-10-CM | POA: Diagnosis not present

## 2018-05-01 DIAGNOSIS — M1711 Unilateral primary osteoarthritis, right knee: Secondary | ICD-10-CM | POA: Diagnosis not present

## 2018-05-03 DIAGNOSIS — K591 Functional diarrhea: Secondary | ICD-10-CM | POA: Diagnosis not present

## 2018-06-28 DIAGNOSIS — E7800 Pure hypercholesterolemia, unspecified: Secondary | ICD-10-CM | POA: Diagnosis not present

## 2018-06-28 DIAGNOSIS — Z79899 Other long term (current) drug therapy: Secondary | ICD-10-CM | POA: Diagnosis not present

## 2018-06-28 DIAGNOSIS — I471 Supraventricular tachycardia: Secondary | ICD-10-CM | POA: Diagnosis not present

## 2018-06-28 DIAGNOSIS — E038 Other specified hypothyroidism: Secondary | ICD-10-CM | POA: Diagnosis not present

## 2018-06-28 DIAGNOSIS — F330 Major depressive disorder, recurrent, mild: Secondary | ICD-10-CM | POA: Diagnosis not present

## 2018-06-28 DIAGNOSIS — E1165 Type 2 diabetes mellitus with hyperglycemia: Secondary | ICD-10-CM | POA: Diagnosis not present

## 2018-06-28 DIAGNOSIS — E669 Obesity, unspecified: Secondary | ICD-10-CM | POA: Diagnosis not present

## 2018-06-28 DIAGNOSIS — I10 Essential (primary) hypertension: Secondary | ICD-10-CM | POA: Diagnosis not present

## 2018-06-28 DIAGNOSIS — Z6834 Body mass index (BMI) 34.0-34.9, adult: Secondary | ICD-10-CM | POA: Diagnosis not present

## 2018-07-05 ENCOUNTER — Other Ambulatory Visit: Payer: Self-pay | Admitting: Pharmacist

## 2018-07-05 NOTE — Patient Outreach (Signed)
[name] Care Management  07/05/2018  [name] 10-Aug-1941 [id number]   Outreach call to [name] regarding her request for follow up from the [facility] Medication Adherence Campaign.  [name] reports that she did not request to speak with me. States that she is taking her medications and denies any medication questions or concerns at this time. Patient declines to speak with me further.  Harlow Asa, PharmD, [facility] [phone]

## 2018-07-12 DIAGNOSIS — L570 Actinic keratosis: Secondary | ICD-10-CM | POA: Diagnosis not present

## 2018-07-12 DIAGNOSIS — D1801 Hemangioma of skin and subcutaneous tissue: Secondary | ICD-10-CM | POA: Diagnosis not present

## 2018-07-12 DIAGNOSIS — L578 Other skin changes due to chronic exposure to nonionizing radiation: Secondary | ICD-10-CM | POA: Diagnosis not present

## 2018-07-12 DIAGNOSIS — L821 Other seborrheic keratosis: Secondary | ICD-10-CM | POA: Diagnosis not present

## 2018-07-21 DIAGNOSIS — Z23 Encounter for immunization: Secondary | ICD-10-CM | POA: Diagnosis not present

## 2018-07-25 DIAGNOSIS — M5432 Sciatica, left side: Secondary | ICD-10-CM | POA: Diagnosis not present

## 2018-08-01 DIAGNOSIS — Z981 Arthrodesis status: Secondary | ICD-10-CM | POA: Diagnosis not present

## 2018-08-01 DIAGNOSIS — M545 Low back pain: Secondary | ICD-10-CM | POA: Diagnosis not present

## 2018-08-01 DIAGNOSIS — M5136 Other intervertebral disc degeneration, lumbar region: Secondary | ICD-10-CM | POA: Diagnosis not present

## 2018-08-17 DIAGNOSIS — M4716 Other spondylosis with myelopathy, lumbar region: Secondary | ICD-10-CM | POA: Diagnosis not present

## 2018-08-17 DIAGNOSIS — M545 Low back pain: Secondary | ICD-10-CM | POA: Diagnosis not present

## 2018-08-21 DIAGNOSIS — M1711 Unilateral primary osteoarthritis, right knee: Secondary | ICD-10-CM | POA: Diagnosis not present

## 2018-08-31 DIAGNOSIS — M1712 Unilateral primary osteoarthritis, left knee: Secondary | ICD-10-CM | POA: Diagnosis not present

## 2018-10-09 DIAGNOSIS — M810 Age-related osteoporosis without current pathological fracture: Secondary | ICD-10-CM | POA: Diagnosis not present

## 2018-10-09 DIAGNOSIS — F3342 Major depressive disorder, recurrent, in full remission: Secondary | ICD-10-CM | POA: Diagnosis not present

## 2018-10-09 DIAGNOSIS — M150 Primary generalized (osteo)arthritis: Secondary | ICD-10-CM | POA: Diagnosis not present

## 2018-10-09 DIAGNOSIS — Z0000 Encounter for general adult medical examination without abnormal findings: Secondary | ICD-10-CM | POA: Diagnosis not present

## 2018-10-09 DIAGNOSIS — I10 Essential (primary) hypertension: Secondary | ICD-10-CM | POA: Diagnosis not present

## 2018-10-09 DIAGNOSIS — E1169 Type 2 diabetes mellitus with other specified complication: Secondary | ICD-10-CM | POA: Diagnosis not present

## 2018-10-09 DIAGNOSIS — Z6834 Body mass index (BMI) 34.0-34.9, adult: Secondary | ICD-10-CM | POA: Diagnosis not present

## 2018-10-09 DIAGNOSIS — E669 Obesity, unspecified: Secondary | ICD-10-CM | POA: Diagnosis not present

## 2018-10-09 DIAGNOSIS — K219 Gastro-esophageal reflux disease without esophagitis: Secondary | ICD-10-CM | POA: Diagnosis not present

## 2018-10-09 DIAGNOSIS — Z79899 Other long term (current) drug therapy: Secondary | ICD-10-CM | POA: Diagnosis not present

## 2018-10-09 DIAGNOSIS — E7800 Pure hypercholesterolemia, unspecified: Secondary | ICD-10-CM | POA: Diagnosis not present

## 2018-12-13 DIAGNOSIS — H2513 Age-related nuclear cataract, bilateral: Secondary | ICD-10-CM | POA: Diagnosis not present

## 2018-12-13 DIAGNOSIS — E119 Type 2 diabetes mellitus without complications: Secondary | ICD-10-CM | POA: Diagnosis not present

## 2018-12-25 DIAGNOSIS — Z1231 Encounter for screening mammogram for malignant neoplasm of breast: Secondary | ICD-10-CM | POA: Diagnosis not present

## 2019-01-08 DIAGNOSIS — Z6835 Body mass index (BMI) 35.0-35.9, adult: Secondary | ICD-10-CM | POA: Diagnosis not present

## 2019-01-08 DIAGNOSIS — E7800 Pure hypercholesterolemia, unspecified: Secondary | ICD-10-CM | POA: Diagnosis not present

## 2019-01-08 DIAGNOSIS — K219 Gastro-esophageal reflux disease without esophagitis: Secondary | ICD-10-CM | POA: Diagnosis not present

## 2019-01-08 DIAGNOSIS — E669 Obesity, unspecified: Secondary | ICD-10-CM | POA: Diagnosis not present

## 2019-01-08 DIAGNOSIS — I10 Essential (primary) hypertension: Secondary | ICD-10-CM | POA: Diagnosis not present

## 2019-01-08 DIAGNOSIS — E1165 Type 2 diabetes mellitus with hyperglycemia: Secondary | ICD-10-CM | POA: Diagnosis not present

## 2019-01-08 DIAGNOSIS — E038 Other specified hypothyroidism: Secondary | ICD-10-CM | POA: Diagnosis not present

## 2019-01-11 DIAGNOSIS — D1801 Hemangioma of skin and subcutaneous tissue: Secondary | ICD-10-CM | POA: Diagnosis not present

## 2019-01-11 DIAGNOSIS — L570 Actinic keratosis: Secondary | ICD-10-CM | POA: Diagnosis not present

## 2019-01-11 DIAGNOSIS — L578 Other skin changes due to chronic exposure to nonionizing radiation: Secondary | ICD-10-CM | POA: Diagnosis not present

## 2019-01-11 DIAGNOSIS — L821 Other seborrheic keratosis: Secondary | ICD-10-CM | POA: Diagnosis not present

## 2019-02-14 DIAGNOSIS — M1711 Unilateral primary osteoarthritis, right knee: Secondary | ICD-10-CM | POA: Diagnosis not present

## 2019-02-21 DIAGNOSIS — M1712 Unilateral primary osteoarthritis, left knee: Secondary | ICD-10-CM | POA: Diagnosis not present

## 2019-04-09 DIAGNOSIS — I10 Essential (primary) hypertension: Secondary | ICD-10-CM | POA: Diagnosis not present

## 2019-04-09 DIAGNOSIS — E038 Other specified hypothyroidism: Secondary | ICD-10-CM | POA: Diagnosis not present

## 2019-04-09 DIAGNOSIS — E1165 Type 2 diabetes mellitus with hyperglycemia: Secondary | ICD-10-CM | POA: Diagnosis not present

## 2019-04-09 DIAGNOSIS — E7800 Pure hypercholesterolemia, unspecified: Secondary | ICD-10-CM | POA: Diagnosis not present

## 2019-04-25 DIAGNOSIS — L0291 Cutaneous abscess, unspecified: Secondary | ICD-10-CM | POA: Diagnosis not present

## 2019-05-21 DIAGNOSIS — M1711 Unilateral primary osteoarthritis, right knee: Secondary | ICD-10-CM | POA: Diagnosis not present

## 2019-05-28 DIAGNOSIS — M1712 Unilateral primary osteoarthritis, left knee: Secondary | ICD-10-CM | POA: Diagnosis not present

## 2019-06-22 DIAGNOSIS — M5432 Sciatica, left side: Secondary | ICD-10-CM | POA: Diagnosis not present

## 2019-07-12 DIAGNOSIS — I10 Essential (primary) hypertension: Secondary | ICD-10-CM | POA: Diagnosis not present

## 2019-07-12 DIAGNOSIS — E7800 Pure hypercholesterolemia, unspecified: Secondary | ICD-10-CM | POA: Diagnosis not present

## 2019-07-12 DIAGNOSIS — E1165 Type 2 diabetes mellitus with hyperglycemia: Secondary | ICD-10-CM | POA: Diagnosis not present

## 2019-07-12 DIAGNOSIS — Z6835 Body mass index (BMI) 35.0-35.9, adult: Secondary | ICD-10-CM | POA: Diagnosis not present

## 2019-07-12 DIAGNOSIS — E669 Obesity, unspecified: Secondary | ICD-10-CM | POA: Diagnosis not present

## 2019-07-12 DIAGNOSIS — M5432 Sciatica, left side: Secondary | ICD-10-CM | POA: Diagnosis not present

## 2019-07-12 DIAGNOSIS — Z79899 Other long term (current) drug therapy: Secondary | ICD-10-CM | POA: Diagnosis not present

## 2019-07-17 DIAGNOSIS — Z23 Encounter for immunization: Secondary | ICD-10-CM | POA: Diagnosis not present

## 2019-07-17 DIAGNOSIS — L821 Other seborrheic keratosis: Secondary | ICD-10-CM | POA: Diagnosis not present

## 2019-07-17 DIAGNOSIS — L578 Other skin changes due to chronic exposure to nonionizing radiation: Secondary | ICD-10-CM | POA: Diagnosis not present

## 2019-07-17 DIAGNOSIS — L570 Actinic keratosis: Secondary | ICD-10-CM | POA: Diagnosis not present

## 2019-07-17 DIAGNOSIS — E1169 Type 2 diabetes mellitus with other specified complication: Secondary | ICD-10-CM | POA: Diagnosis not present

## 2019-08-22 DIAGNOSIS — M1711 Unilateral primary osteoarthritis, right knee: Secondary | ICD-10-CM | POA: Diagnosis not present

## 2019-08-22 DIAGNOSIS — M1712 Unilateral primary osteoarthritis, left knee: Secondary | ICD-10-CM | POA: Diagnosis not present

## 2019-08-27 DIAGNOSIS — M1712 Unilateral primary osteoarthritis, left knee: Secondary | ICD-10-CM | POA: Diagnosis not present

## 2019-09-10 DIAGNOSIS — M5430 Sciatica, unspecified side: Secondary | ICD-10-CM | POA: Diagnosis not present

## 2019-09-19 ENCOUNTER — Other Ambulatory Visit: Payer: Self-pay

## 2019-10-12 DIAGNOSIS — E7800 Pure hypercholesterolemia, unspecified: Secondary | ICD-10-CM | POA: Diagnosis not present

## 2019-10-12 DIAGNOSIS — E669 Obesity, unspecified: Secondary | ICD-10-CM | POA: Diagnosis not present

## 2019-10-12 DIAGNOSIS — F3342 Major depressive disorder, recurrent, in full remission: Secondary | ICD-10-CM | POA: Diagnosis not present

## 2019-10-12 DIAGNOSIS — Z85038 Personal history of other malignant neoplasm of large intestine: Secondary | ICD-10-CM | POA: Diagnosis not present

## 2019-10-12 DIAGNOSIS — Z0000 Encounter for general adult medical examination without abnormal findings: Secondary | ICD-10-CM | POA: Diagnosis not present

## 2019-10-12 DIAGNOSIS — I10 Essential (primary) hypertension: Secondary | ICD-10-CM | POA: Diagnosis not present

## 2019-10-12 DIAGNOSIS — E038 Other specified hypothyroidism: Secondary | ICD-10-CM | POA: Diagnosis not present

## 2019-10-12 DIAGNOSIS — E1169 Type 2 diabetes mellitus with other specified complication: Secondary | ICD-10-CM | POA: Diagnosis not present

## 2019-10-12 DIAGNOSIS — K219 Gastro-esophageal reflux disease without esophagitis: Secondary | ICD-10-CM | POA: Diagnosis not present

## 2019-10-12 DIAGNOSIS — M810 Age-related osteoporosis without current pathological fracture: Secondary | ICD-10-CM | POA: Diagnosis not present

## 2019-10-12 DIAGNOSIS — Z6834 Body mass index (BMI) 34.0-34.9, adult: Secondary | ICD-10-CM | POA: Diagnosis not present

## 2019-10-12 DIAGNOSIS — I471 Supraventricular tachycardia: Secondary | ICD-10-CM | POA: Diagnosis not present

## 2019-10-30 DIAGNOSIS — M545 Low back pain: Secondary | ICD-10-CM | POA: Diagnosis not present

## 2019-10-30 DIAGNOSIS — M549 Dorsalgia, unspecified: Secondary | ICD-10-CM | POA: Diagnosis not present

## 2019-11-21 DIAGNOSIS — M1711 Unilateral primary osteoarthritis, right knee: Secondary | ICD-10-CM | POA: Diagnosis not present

## 2019-11-22 DIAGNOSIS — M4716 Other spondylosis with myelopathy, lumbar region: Secondary | ICD-10-CM | POA: Diagnosis not present

## 2019-11-22 DIAGNOSIS — M545 Low back pain: Secondary | ICD-10-CM | POA: Diagnosis not present

## 2019-11-22 DIAGNOSIS — M5106 Intervertebral disc disorders with myelopathy, lumbar region: Secondary | ICD-10-CM | POA: Diagnosis not present

## 2019-11-26 DIAGNOSIS — M1712 Unilateral primary osteoarthritis, left knee: Secondary | ICD-10-CM | POA: Diagnosis not present

## 2019-12-17 DIAGNOSIS — M7918 Myalgia, other site: Secondary | ICD-10-CM | POA: Diagnosis not present

## 2019-12-20 DIAGNOSIS — M4716 Other spondylosis with myelopathy, lumbar region: Secondary | ICD-10-CM | POA: Diagnosis not present

## 2019-12-20 DIAGNOSIS — M5136 Other intervertebral disc degeneration, lumbar region: Secondary | ICD-10-CM | POA: Diagnosis not present

## 2019-12-20 DIAGNOSIS — M48061 Spinal stenosis, lumbar region without neurogenic claudication: Secondary | ICD-10-CM | POA: Diagnosis not present

## 2020-01-01 DIAGNOSIS — E1165 Type 2 diabetes mellitus with hyperglycemia: Secondary | ICD-10-CM | POA: Diagnosis not present

## 2020-01-01 DIAGNOSIS — M2550 Pain in unspecified joint: Secondary | ICD-10-CM | POA: Diagnosis not present

## 2020-01-01 DIAGNOSIS — Z79899 Other long term (current) drug therapy: Secondary | ICD-10-CM | POA: Diagnosis not present

## 2020-01-01 DIAGNOSIS — E7800 Pure hypercholesterolemia, unspecified: Secondary | ICD-10-CM | POA: Diagnosis not present

## 2020-01-01 DIAGNOSIS — I10 Essential (primary) hypertension: Secondary | ICD-10-CM | POA: Diagnosis not present

## 2020-01-01 DIAGNOSIS — M791 Myalgia, unspecified site: Secondary | ICD-10-CM | POA: Diagnosis not present

## 2020-01-01 DIAGNOSIS — M810 Age-related osteoporosis without current pathological fracture: Secondary | ICD-10-CM | POA: Diagnosis not present

## 2020-01-03 DIAGNOSIS — M5136 Other intervertebral disc degeneration, lumbar region: Secondary | ICD-10-CM | POA: Diagnosis not present

## 2020-01-03 DIAGNOSIS — M545 Low back pain: Secondary | ICD-10-CM | POA: Diagnosis not present

## 2020-01-03 DIAGNOSIS — M48061 Spinal stenosis, lumbar region without neurogenic claudication: Secondary | ICD-10-CM | POA: Diagnosis not present

## 2020-01-03 DIAGNOSIS — M4716 Other spondylosis with myelopathy, lumbar region: Secondary | ICD-10-CM | POA: Diagnosis not present

## 2020-01-09 DIAGNOSIS — M5136 Other intervertebral disc degeneration, lumbar region: Secondary | ICD-10-CM | POA: Diagnosis not present

## 2020-01-10 DIAGNOSIS — E7800 Pure hypercholesterolemia, unspecified: Secondary | ICD-10-CM | POA: Diagnosis not present

## 2020-01-10 DIAGNOSIS — E038 Other specified hypothyroidism: Secondary | ICD-10-CM | POA: Diagnosis not present

## 2020-01-10 DIAGNOSIS — M150 Primary generalized (osteo)arthritis: Secondary | ICD-10-CM | POA: Diagnosis not present

## 2020-01-10 DIAGNOSIS — M48061 Spinal stenosis, lumbar region without neurogenic claudication: Secondary | ICD-10-CM | POA: Diagnosis not present

## 2020-01-10 DIAGNOSIS — E1169 Type 2 diabetes mellitus with other specified complication: Secondary | ICD-10-CM | POA: Diagnosis not present

## 2020-01-10 DIAGNOSIS — Z1231 Encounter for screening mammogram for malignant neoplasm of breast: Secondary | ICD-10-CM | POA: Diagnosis not present

## 2020-01-10 DIAGNOSIS — F3342 Major depressive disorder, recurrent, in full remission: Secondary | ICD-10-CM | POA: Diagnosis not present

## 2020-01-10 DIAGNOSIS — I10 Essential (primary) hypertension: Secondary | ICD-10-CM | POA: Diagnosis not present

## 2020-01-14 DIAGNOSIS — L821 Other seborrheic keratosis: Secondary | ICD-10-CM | POA: Diagnosis not present

## 2020-01-14 DIAGNOSIS — L570 Actinic keratosis: Secondary | ICD-10-CM | POA: Diagnosis not present

## 2020-01-14 DIAGNOSIS — D1801 Hemangioma of skin and subcutaneous tissue: Secondary | ICD-10-CM | POA: Diagnosis not present

## 2020-01-14 DIAGNOSIS — L578 Other skin changes due to chronic exposure to nonionizing radiation: Secondary | ICD-10-CM | POA: Diagnosis not present

## 2020-01-23 DIAGNOSIS — M180 Bilateral primary osteoarthritis of first carpometacarpal joints: Secondary | ICD-10-CM | POA: Diagnosis not present

## 2020-01-23 DIAGNOSIS — M1812 Unilateral primary osteoarthritis of first carpometacarpal joint, left hand: Secondary | ICD-10-CM | POA: Diagnosis not present

## 2020-01-23 DIAGNOSIS — M189 Osteoarthritis of first carpometacarpal joint, unspecified: Secondary | ICD-10-CM | POA: Diagnosis not present

## 2020-01-23 DIAGNOSIS — M1811 Unilateral primary osteoarthritis of first carpometacarpal joint, right hand: Secondary | ICD-10-CM | POA: Diagnosis not present

## 2020-01-23 DIAGNOSIS — M79642 Pain in left hand: Secondary | ICD-10-CM | POA: Diagnosis not present

## 2020-01-23 DIAGNOSIS — M79641 Pain in right hand: Secondary | ICD-10-CM | POA: Diagnosis not present

## 2020-02-08 DIAGNOSIS — M1711 Unilateral primary osteoarthritis, right knee: Secondary | ICD-10-CM | POA: Diagnosis not present

## 2020-02-08 DIAGNOSIS — M170 Bilateral primary osteoarthritis of knee: Secondary | ICD-10-CM | POA: Diagnosis not present

## 2020-02-08 DIAGNOSIS — M1712 Unilateral primary osteoarthritis, left knee: Secondary | ICD-10-CM | POA: Diagnosis not present

## 2020-02-15 DIAGNOSIS — M5136 Other intervertebral disc degeneration, lumbar region: Secondary | ICD-10-CM | POA: Diagnosis not present

## 2020-02-15 DIAGNOSIS — M48061 Spinal stenosis, lumbar region without neurogenic claudication: Secondary | ICD-10-CM | POA: Diagnosis not present

## 2020-02-15 DIAGNOSIS — M4716 Other spondylosis with myelopathy, lumbar region: Secondary | ICD-10-CM | POA: Diagnosis not present

## 2020-02-18 DIAGNOSIS — M5136 Other intervertebral disc degeneration, lumbar region: Secondary | ICD-10-CM | POA: Diagnosis not present

## 2020-02-20 DIAGNOSIS — M810 Age-related osteoporosis without current pathological fracture: Secondary | ICD-10-CM | POA: Diagnosis not present

## 2020-02-20 DIAGNOSIS — Z1231 Encounter for screening mammogram for malignant neoplasm of breast: Secondary | ICD-10-CM | POA: Diagnosis not present

## 2020-02-20 DIAGNOSIS — M859 Disorder of bone density and structure, unspecified: Secondary | ICD-10-CM | POA: Diagnosis not present

## 2020-02-20 DIAGNOSIS — M85832 Other specified disorders of bone density and structure, left forearm: Secondary | ICD-10-CM | POA: Diagnosis not present

## 2020-02-20 DIAGNOSIS — M8589 Other specified disorders of bone density and structure, multiple sites: Secondary | ICD-10-CM | POA: Diagnosis not present

## 2020-02-22 DIAGNOSIS — M1712 Unilateral primary osteoarthritis, left knee: Secondary | ICD-10-CM | POA: Diagnosis not present

## 2020-03-05 DIAGNOSIS — M48061 Spinal stenosis, lumbar region without neurogenic claudication: Secondary | ICD-10-CM | POA: Diagnosis not present

## 2020-03-05 DIAGNOSIS — M5136 Other intervertebral disc degeneration, lumbar region: Secondary | ICD-10-CM | POA: Diagnosis not present

## 2020-03-13 DIAGNOSIS — M4722 Other spondylosis with radiculopathy, cervical region: Secondary | ICD-10-CM | POA: Diagnosis not present

## 2020-03-13 DIAGNOSIS — M542 Cervicalgia: Secondary | ICD-10-CM | POA: Diagnosis not present

## 2020-03-17 DIAGNOSIS — M47816 Spondylosis without myelopathy or radiculopathy, lumbar region: Secondary | ICD-10-CM | POA: Diagnosis not present

## 2020-03-19 DIAGNOSIS — M170 Bilateral primary osteoarthritis of knee: Secondary | ICD-10-CM | POA: Diagnosis not present

## 2020-03-26 DIAGNOSIS — M170 Bilateral primary osteoarthritis of knee: Secondary | ICD-10-CM | POA: Diagnosis not present

## 2020-03-31 DIAGNOSIS — R922 Inconclusive mammogram: Secondary | ICD-10-CM | POA: Diagnosis not present

## 2020-03-31 DIAGNOSIS — R928 Other abnormal and inconclusive findings on diagnostic imaging of breast: Secondary | ICD-10-CM | POA: Diagnosis not present

## 2020-04-02 DIAGNOSIS — M170 Bilateral primary osteoarthritis of knee: Secondary | ICD-10-CM | POA: Diagnosis not present

## 2020-04-04 DIAGNOSIS — M542 Cervicalgia: Secondary | ICD-10-CM | POA: Diagnosis not present

## 2020-04-04 DIAGNOSIS — G894 Chronic pain syndrome: Secondary | ICD-10-CM | POA: Diagnosis not present

## 2020-04-09 DIAGNOSIS — Z6835 Body mass index (BMI) 35.0-35.9, adult: Secondary | ICD-10-CM | POA: Diagnosis not present

## 2020-04-09 DIAGNOSIS — M4722 Other spondylosis with radiculopathy, cervical region: Secondary | ICD-10-CM | POA: Diagnosis not present

## 2020-04-09 DIAGNOSIS — M47816 Spondylosis without myelopathy or radiculopathy, lumbar region: Secondary | ICD-10-CM | POA: Diagnosis not present

## 2020-04-14 DIAGNOSIS — M2550 Pain in unspecified joint: Secondary | ICD-10-CM | POA: Diagnosis not present

## 2020-04-14 DIAGNOSIS — Z6831 Body mass index (BMI) 31.0-31.9, adult: Secondary | ICD-10-CM | POA: Diagnosis not present

## 2020-04-14 DIAGNOSIS — E7800 Pure hypercholesterolemia, unspecified: Secondary | ICD-10-CM | POA: Diagnosis not present

## 2020-04-14 DIAGNOSIS — I10 Essential (primary) hypertension: Secondary | ICD-10-CM | POA: Diagnosis not present

## 2020-04-14 DIAGNOSIS — E1169 Type 2 diabetes mellitus with other specified complication: Secondary | ICD-10-CM | POA: Diagnosis not present

## 2020-04-14 DIAGNOSIS — E669 Obesity, unspecified: Secondary | ICD-10-CM | POA: Diagnosis not present

## 2020-04-14 DIAGNOSIS — M064 Inflammatory polyarthropathy: Secondary | ICD-10-CM | POA: Diagnosis not present

## 2020-04-14 DIAGNOSIS — M810 Age-related osteoporosis without current pathological fracture: Secondary | ICD-10-CM | POA: Diagnosis not present

## 2020-04-14 DIAGNOSIS — E038 Other specified hypothyroidism: Secondary | ICD-10-CM | POA: Diagnosis not present

## 2020-04-17 DIAGNOSIS — M5030 Other cervical disc degeneration, unspecified cervical region: Secondary | ICD-10-CM | POA: Diagnosis not present

## 2020-04-23 DIAGNOSIS — M5412 Radiculopathy, cervical region: Secondary | ICD-10-CM | POA: Diagnosis not present

## 2020-04-23 DIAGNOSIS — M47812 Spondylosis without myelopathy or radiculopathy, cervical region: Secondary | ICD-10-CM | POA: Diagnosis not present

## 2020-04-29 DIAGNOSIS — M5136 Other intervertebral disc degeneration, lumbar region: Secondary | ICD-10-CM | POA: Diagnosis not present

## 2020-04-30 DIAGNOSIS — M1711 Unilateral primary osteoarthritis, right knee: Secondary | ICD-10-CM | POA: Diagnosis not present

## 2020-04-30 DIAGNOSIS — M170 Bilateral primary osteoarthritis of knee: Secondary | ICD-10-CM | POA: Diagnosis not present

## 2020-05-07 DIAGNOSIS — M1712 Unilateral primary osteoarthritis, left knee: Secondary | ICD-10-CM | POA: Diagnosis not present

## 2020-05-22 DIAGNOSIS — M47812 Spondylosis without myelopathy or radiculopathy, cervical region: Secondary | ICD-10-CM | POA: Diagnosis not present

## 2020-06-02 DIAGNOSIS — E119 Type 2 diabetes mellitus without complications: Secondary | ICD-10-CM | POA: Diagnosis not present

## 2020-06-02 DIAGNOSIS — H2513 Age-related nuclear cataract, bilateral: Secondary | ICD-10-CM | POA: Diagnosis not present

## 2020-06-12 DIAGNOSIS — M5416 Radiculopathy, lumbar region: Secondary | ICD-10-CM | POA: Diagnosis not present

## 2020-06-12 DIAGNOSIS — E139 Other specified diabetes mellitus without complications: Secondary | ICD-10-CM | POA: Diagnosis not present

## 2020-06-12 DIAGNOSIS — M5412 Radiculopathy, cervical region: Secondary | ICD-10-CM | POA: Diagnosis not present

## 2020-07-10 DIAGNOSIS — M961 Postlaminectomy syndrome, not elsewhere classified: Secondary | ICD-10-CM | POA: Diagnosis not present

## 2020-07-10 DIAGNOSIS — M5136 Other intervertebral disc degeneration, lumbar region: Secondary | ICD-10-CM | POA: Diagnosis not present

## 2020-07-14 DIAGNOSIS — G894 Chronic pain syndrome: Secondary | ICD-10-CM | POA: Diagnosis not present

## 2020-07-14 DIAGNOSIS — E669 Obesity, unspecified: Secondary | ICD-10-CM | POA: Diagnosis not present

## 2020-07-14 DIAGNOSIS — Z6831 Body mass index (BMI) 31.0-31.9, adult: Secondary | ICD-10-CM | POA: Diagnosis not present

## 2020-07-14 DIAGNOSIS — E7800 Pure hypercholesterolemia, unspecified: Secondary | ICD-10-CM | POA: Diagnosis not present

## 2020-07-14 DIAGNOSIS — E038 Other specified hypothyroidism: Secondary | ICD-10-CM | POA: Diagnosis not present

## 2020-07-14 DIAGNOSIS — Z79899 Other long term (current) drug therapy: Secondary | ICD-10-CM | POA: Diagnosis not present

## 2020-07-14 DIAGNOSIS — E1169 Type 2 diabetes mellitus with other specified complication: Secondary | ICD-10-CM | POA: Diagnosis not present

## 2020-07-14 DIAGNOSIS — I10 Essential (primary) hypertension: Secondary | ICD-10-CM | POA: Diagnosis not present

## 2020-07-17 DIAGNOSIS — L570 Actinic keratosis: Secondary | ICD-10-CM | POA: Diagnosis not present

## 2020-07-17 DIAGNOSIS — L821 Other seborrheic keratosis: Secondary | ICD-10-CM | POA: Diagnosis not present

## 2020-07-17 DIAGNOSIS — L578 Other skin changes due to chronic exposure to nonionizing radiation: Secondary | ICD-10-CM | POA: Diagnosis not present

## 2020-07-24 DIAGNOSIS — M47812 Spondylosis without myelopathy or radiculopathy, cervical region: Secondary | ICD-10-CM | POA: Diagnosis not present

## 2020-07-24 DIAGNOSIS — M5416 Radiculopathy, lumbar region: Secondary | ICD-10-CM | POA: Diagnosis not present

## 2020-07-25 DIAGNOSIS — M1711 Unilateral primary osteoarthritis, right knee: Secondary | ICD-10-CM | POA: Diagnosis not present

## 2020-08-08 DIAGNOSIS — M1712 Unilateral primary osteoarthritis, left knee: Secondary | ICD-10-CM | POA: Diagnosis not present

## 2020-08-20 DIAGNOSIS — Z23 Encounter for immunization: Secondary | ICD-10-CM | POA: Diagnosis not present

## 2020-09-26 DIAGNOSIS — E7800 Pure hypercholesterolemia, unspecified: Secondary | ICD-10-CM | POA: Diagnosis not present

## 2020-09-26 DIAGNOSIS — E1169 Type 2 diabetes mellitus with other specified complication: Secondary | ICD-10-CM | POA: Diagnosis not present

## 2020-09-26 DIAGNOSIS — G894 Chronic pain syndrome: Secondary | ICD-10-CM | POA: Diagnosis not present

## 2020-09-26 DIAGNOSIS — Z6832 Body mass index (BMI) 32.0-32.9, adult: Secondary | ICD-10-CM | POA: Diagnosis not present

## 2020-09-26 DIAGNOSIS — I10 Essential (primary) hypertension: Secondary | ICD-10-CM | POA: Diagnosis not present

## 2020-09-26 DIAGNOSIS — Z85038 Personal history of other malignant neoplasm of large intestine: Secondary | ICD-10-CM | POA: Diagnosis not present

## 2020-09-26 DIAGNOSIS — E038 Other specified hypothyroidism: Secondary | ICD-10-CM | POA: Diagnosis not present

## 2020-09-26 DIAGNOSIS — E669 Obesity, unspecified: Secondary | ICD-10-CM | POA: Diagnosis not present

## 2020-10-01 DIAGNOSIS — M5416 Radiculopathy, lumbar region: Secondary | ICD-10-CM | POA: Diagnosis not present

## 2020-10-01 DIAGNOSIS — M961 Postlaminectomy syndrome, not elsewhere classified: Secondary | ICD-10-CM | POA: Diagnosis not present

## 2020-10-13 DIAGNOSIS — Z0000 Encounter for general adult medical examination without abnormal findings: Secondary | ICD-10-CM | POA: Diagnosis not present

## 2020-10-13 DIAGNOSIS — E669 Obesity, unspecified: Secondary | ICD-10-CM | POA: Diagnosis not present

## 2020-10-13 DIAGNOSIS — Z6833 Body mass index (BMI) 33.0-33.9, adult: Secondary | ICD-10-CM | POA: Diagnosis not present

## 2020-10-13 DIAGNOSIS — K219 Gastro-esophageal reflux disease without esophagitis: Secondary | ICD-10-CM | POA: Diagnosis not present

## 2020-10-27 DIAGNOSIS — M1711 Unilateral primary osteoarthritis, right knee: Secondary | ICD-10-CM | POA: Diagnosis not present

## 2020-11-07 DIAGNOSIS — M1712 Unilateral primary osteoarthritis, left knee: Secondary | ICD-10-CM | POA: Diagnosis not present

## 2020-11-25 DIAGNOSIS — M7121 Synovial cyst of popliteal space [Baker], right knee: Secondary | ICD-10-CM | POA: Diagnosis not present

## 2021-01-07 ENCOUNTER — Encounter: Payer: Self-pay | Admitting: Psychology

## 2021-01-19 DIAGNOSIS — L578 Other skin changes due to chronic exposure to nonionizing radiation: Secondary | ICD-10-CM | POA: Diagnosis not present

## 2021-01-19 DIAGNOSIS — L570 Actinic keratosis: Secondary | ICD-10-CM | POA: Diagnosis not present

## 2021-01-19 DIAGNOSIS — L820 Inflamed seborrheic keratosis: Secondary | ICD-10-CM | POA: Diagnosis not present

## 2021-01-19 DIAGNOSIS — L821 Other seborrheic keratosis: Secondary | ICD-10-CM | POA: Diagnosis not present

## 2021-01-26 ENCOUNTER — Encounter: Payer: PPO | Attending: Psychology | Admitting: Psychology

## 2021-01-26 ENCOUNTER — Other Ambulatory Visit: Payer: Self-pay

## 2021-01-26 DIAGNOSIS — G894 Chronic pain syndrome: Secondary | ICD-10-CM | POA: Diagnosis not present

## 2021-01-26 DIAGNOSIS — M961 Postlaminectomy syndrome, not elsewhere classified: Secondary | ICD-10-CM | POA: Insufficient documentation

## 2021-01-26 DIAGNOSIS — Z01818 Encounter for other preprocedural examination: Secondary | ICD-10-CM | POA: Insufficient documentation

## 2021-02-02 DIAGNOSIS — M1711 Unilateral primary osteoarthritis, right knee: Secondary | ICD-10-CM | POA: Diagnosis not present

## 2021-02-09 ENCOUNTER — Other Ambulatory Visit: Payer: Self-pay

## 2021-02-09 ENCOUNTER — Encounter: Payer: Self-pay | Admitting: Psychology

## 2021-02-09 ENCOUNTER — Encounter: Payer: PPO | Admitting: Psychology

## 2021-02-09 DIAGNOSIS — M1711 Unilateral primary osteoarthritis, right knee: Secondary | ICD-10-CM | POA: Diagnosis not present

## 2021-02-09 DIAGNOSIS — M1712 Unilateral primary osteoarthritis, left knee: Secondary | ICD-10-CM | POA: Diagnosis not present

## 2021-02-09 DIAGNOSIS — M170 Bilateral primary osteoarthritis of knee: Secondary | ICD-10-CM | POA: Diagnosis not present

## 2021-02-09 NOTE — Progress Notes (Signed)
Neuropsychological Consultation   Patient:   [name]   DOB:   December 19, 1940  MR Number:  [id number]  Location:  [facility] [place], [place] [id number] [place] [address] Dept: [phone]           Date of Service:   01/27/2020  Start Time:   3 PM End Time:   5 PM  Provider/Observer:  Ilean Skill, Psy.D.       Clinical Neuropsychologist       Billing Code/Service: Psychiatric/psychological clinical interview   Reason for Service:  [name] is an 80 year old female referred by Harrold Donath, MD for psychological evaluation as part of the standard work-up to determine appropriateness for spinal cord stimulator trialing and possible implantation.  As part of these evaluations patient's routinely have full psychological evaluations to assess for any potential psychological or psychiatric issues along with psychosocial issues that may create complexities during the trialing phase in particular and other issues that may complicate successful implementation of spinal cord stimulator procedures.  The patient reports significant back pain and has had prior back surgeries as well as colon cancer.  The patient reports that she continues to struggle and cannot stand for over 10 minutes at a time before the pain starts to increase the point where she can no longer stand up.  The patient denies any significant depression or anxiety but just issues related to her significant pain symptoms.  The patient was treated for colon cancer in 2001 and had back surgery in 2007.  She also broke her hip in 2019.  Other past medical history include SVT, hypertension, elevated lipids, diabetes that has been uncontrolled in the past until current medication regimens.  Patient also has been treated for hypertension and hyperlipidemia and hypothyroidism.  Current medications include but are  not limited to Synthroid, and medications for diabetes.  The patient denies any significant psychosocial stressors now and that she is managing her household well but has some help with upkeep of the house due to significant back pain.  The patient reports that she sleeps well and infrequently has difficulty at night.  She describes good appetite, good memory and good overall functioning.  There is no previous psychiatric history.   Behavioral Observation: [name]  presents as a 80 y.o.-year-old Right handed Female who appeared her stated age. her dress was Appropriate and she was Well Groomed and her manners were Appropriate to the situation.  her participation was indicative of Appropriate and Attentive behaviors.  There were physical disabilities noted.  she displayed an appropriate level of cooperation and motivation.     Interactions:    Active Appropriate and Attentive  Attention:   within normal limits and attention span and concentration were age appropriate  Memory:   within normal limits; recent and remote memory intact  Visuo-spatial:  not examined  Speech (Volume):  normal  Speech:   normal; normal  Thought Process:  Coherent and Relevant  Though Content:  WNL; not suicidal and not homicidal  Orientation:   person, place, time/date and situation  Judgment:   Good  Planning:   Good  Affect:    Appropriate  Mood:    Euthymic  Insight:   Good  Intelligence:   normal  Marital Status/Living: The patient was born and raised in [place] along with 4 siblings.  Only major childhood illness was developing [facility]  spotted fever as a child.  The patient currently lives alone and is a widow.  She was married for 33 years and has been widowed now for 17 years.  The patient has 1 adult son.  Current Employment: The patient is retired.  Past Employment:  The patient worked for 38 years with [employer] and this was her  primary employment.  Substance Use:  No concerns of substance abuse are reported.  The patient denies any substance use and denies alcohol and tobacco use.  Education:   HS Graduate  Medical History:   Past Medical History:  Diagnosis Date  . AVNRT (AV nodal re-entry tachycardia) ([place])    a. s/p catheter ablation on 09/30/14  . Colon cancer ([place])    stage 1  . Hyperlipidemia   . Hypertension   . Hypothyroidism   . Shingles   . Squamous cell carcinoma   . Supraventricular tachycardia ([place])   . Type 2 diabetes mellitus ([place])   . Ulcerative colitis ([place])    intermittent mild flares         Patient Active Problem List   Diagnosis Date Noted  . Hypertension   . Hyperlipidemia   . Type 2 diabetes mellitus ([place])   . Hypothyroidism   . Colon cancer ([place])   . AVNRT (AV nodal re-entry tachycardia) ([place])   . SVT (supraventricular tachycardia) ([place]) 08/21/2014  . HTN (hypertension) 08/21/2014  . Elevated lipids 08/21/2014  . DM (diabetes mellitus) type 2, uncontrolled, with ketoacidosis ([place]) 08/21/2014     Psychiatric History:  No prior psychiatric history  Family Med/Psych History:  Family History  Problem Relation Age of Onset  . Cancer Father   . Heart attack Mother   . Diabetes Mother   . Arrhythmia Brother   . Cancer Sister   . Diabetes Sister     Risk of Suicide/Violence: virtually non-existent  Impression/DX:  [name] is an 80 year old female referred by Harrold Donath, MD for psychological evaluation as part of the standard work-up to determine appropriateness for spinal cord stimulator trialing and possible implantation.  As part of these evaluations patient's routinely have full psychological evaluations to assess for any potential psychological or psychiatric issues along with psychosocial issues that may create complexities during the trialing phase in particular and other issues that may complicate successful implementation of spinal cord stimulator  procedures.  The patient reports significant back pain and has had prior back surgeries as well as colon cancer.  The patient reports that she continues to struggle and cannot stand for over 10 minutes at a time before the pain starts to increase the point where she can no longer stand up.  The patient denies any significant depression or anxiety but just issues related to her significant pain symptoms.  The patient was treated for colon cancer in 2001 and had back surgery in 2007.  She also broke her hip in 2019.  Other past medical history include SVT, hypertension, elevated lipids, diabetes that has been uncontrolled in the past until current medication regimens.  Patient also has been treated for hypertension and hyperlipidemia and hypothyroidism.  Current medications include but are not limited to Synthroid, and medications for diabetes.  Disposition/Plan:  We have set the patient up for the objective assessment port of the overall formal psychological evaluation is requested.  The patient will complete the [place] multiphasic personality inventory as well as the pain patient profile and a formal report will be completed and provided to  Dr. Herma Mering as part of his work-up.  Diagnosis:    Chronic pain syndrome  Post laminectomy syndrome         Electronically Signed   _______________________ Ilean Skill, Psy.D. Clinical Neuropsychologist

## 2021-02-18 DIAGNOSIS — M5459 Other low back pain: Secondary | ICD-10-CM | POA: Diagnosis not present

## 2021-03-05 ENCOUNTER — Other Ambulatory Visit: Payer: Self-pay

## 2021-03-05 ENCOUNTER — Encounter: Payer: Self-pay | Admitting: Psychology

## 2021-03-05 ENCOUNTER — Encounter: Payer: PPO | Attending: Psychology | Admitting: Psychology

## 2021-03-05 DIAGNOSIS — Z01818 Encounter for other preprocedural examination: Secondary | ICD-10-CM | POA: Insufficient documentation

## 2021-03-05 DIAGNOSIS — G894 Chronic pain syndrome: Secondary | ICD-10-CM | POA: Diagnosis not present

## 2021-03-05 DIAGNOSIS — M961 Postlaminectomy syndrome, not elsewhere classified: Secondary | ICD-10-CM | POA: Insufficient documentation

## 2021-03-05 NOTE — Progress Notes (Signed)
Psychological Evaluation   Patient:  [name]   DOB: [birth year]  MR Number: [id number]  Location: [facility] [place] [facility] [place], [id number] [place] [id number] Dept: [phone]  Start: 10 AM End: 11 AM  Today's visit included 1 hour and 30 minutes of time.  30 minutes were spent with the scoring of objective psychological measures as part of the objective psychological evaluation.  The patient completed the [place] multiphasic personality inventory as well as the pain patient profile.  The other hour was spent in report writing and interpretation of psychological test materials.  Provider/Observer:     Edgardo Roys PsyD  Chief Complaint:      Chief Complaint  Patient presents with  . Back Pain  . Pain    Reason For Service:     [name] is an 80 year old female referred by Harrold Donath, MD for psychological evaluation as part of the standard work-up to determine appropriateness for spinal cord stimulator trialing and possible implantation.  As part of these evaluations patient's routinely have full psychological evaluations to assess for any potential psychological or psychiatric issues along with psychosocial issues that may create complexities during the trialing phase in particular and other issues that may complicate successful implementation of spinal cord stimulator procedures.  The patient reports significant back pain and has had prior back surgeries as well as colon cancer.  The patient reports that she continues to struggle and cannot stand for over 10 minutes at a time before the pain starts to increase the point where she can no longer stand up.  The patient denies any significant depression or anxiety but just issues related to her significant pain symptoms.  The patient was treated for colon cancer in 2001 and had back surgery in 2007.  She  also broke her hip in 2019.  Other past medical history include SVT, hypertension, elevated lipids, diabetes that has been uncontrolled in the past until current medication regimens.  Patient also has been treated for hypertension and hyperlipidemia and hypothyroidism.  Current medications include but are not limited to Synthroid, and medications for diabetes.  The patient denies any significant psychosocial stressors now and that she is managing her household well but has some help with upkeep of the house due to significant back pain.  The patient reports that she sleeps well and infrequently has difficulty at night.  She describes good appetite, good memory and good overall functioning.  There is no previous psychiatric history.  Testing Administered:  [place] multiphasic personality inventory-II and the pain patient profile (P3).  Participation Level:   Active  Participation Quality:  Appropriate      Behavioral Observation:  Well Groomed, Alert, and Appropriate.   Summary of Results:   The patient was given the [place] multiphasic personality inventory-2 and this measure was scored and interpreted.  The patient produced a valid profile as estimated by her validity index scores and showed no indications of a significant efforts to either exaggerate or minimize her current psychological difficulties and distress.  The patient did endorse items related to specific health concerns that are consistent with the patient's overall subjective symptoms and reports.  There were no clinically significant elevations on measures of depression, anxiety, manic or psychotic symptoms and no indications of significant psychiatric illness on basic/fundamental clinical scales.  Further analysis utilizing supplemental and content scales do show that the patient is putting some effort  in managing and coping with chronic stressors most likely directly related to her significant pain difficulties.  The patient showed  no clinically significant elevation on scales shown through research to be associated with vulnerabilities to alcohol or substance abuse and the patient also has no significant elevation on scales associated with chronic or acute PTSD type symptoms.  The patient does endorse issues related to physical malfunction and physical difficulties as well as some potential mild issues coping with social situations that likely exacerbate her pain symptoms.  The patient is likely frustrated by significant stressors which would be consistent with her reports of struggling with physical limitations due to significant pain and back difficulties.  Overall, the results of the patient's MMPI do not suggest any clinically significant psychiatric illness either acutely or chronically.  The patient was also administered the pain patient profile (P3) to get a further assessment of acute symptomatology particularly with regard to anxiety and depression and to assess levels of possible somatizations of her difficulties.  While the MMPI is normed on a clinical population without specific issues of chronic pain and other pain syndromes the pain patient profile is normed on on populations of individuals with chronic pain without significant psychiatric illness to adjust for some of the potential elevations on MMPI scales that can be elevated in chronic pain situations to avoid over interpretation of those scales related to psychiatric illness.  The patient produced a valid profile with no significant elevations in items associated with exaggerating or minimizing significant symptomatology on this measure.  The patient showed no clinically significant elevations relative to a community-based sample without chronic pain and showed very low elevations in measures of anxiety and depression or some matization relative to a chronic pain patient profile.  There were no indications of significant anxiety or depression on this measure or  indications of some matization of difficulties related to anxiety or depression into physical symptoms.  Impression/Diagnosis:   Overall, the results of the current objective psychological evaluation as well as review of the patient's subjective symptom reports and other clinically relevant psychosocial and psychological features in conjunction with the patient's available medical records do suggest that as far as criterion regarding psychological status, the patient appears to be an excellent candidate for spinal cord stimulator trialing and possible implantation.  The patient does not report or describe any significant psychosocial stressors or other factors that would limit her ability to accurately interpret the effectiveness or response during the trialing phase of the spinal cord stimulator and the patient cognitively appears to be able to understand potential risk versus benefit information and make competent decisions as far as proceeding with the procedure.  There are no indications of any psychiatric symptoms that would interfere with her ability to fully participate in provide accurate information and no indication of any somatizations disorder or other psychiatric/psychological variables that would complicate accurate interpretation during the trialing phase or pose issues post full implantation of a spinal cord stimulator.  Again, the patient appears to be an excellent candidate from a psychological/psychiatric standpoint for spinal cord stimulator trialing phase and possible implantation.  The patient does have subjective symptoms and reports that would be consistent with her chronic pain syndrome and her medical data would also be consistent with this being a long-term issue that is causing a significant deleterious effect on her overall life functioning.  Diagnosis:    Chronic pain syndrome  Postlaminectomy syndrome, lumbar region   _____________________ Ilean Skill,  Psy.D. Clinical Neuropsychologist

## 2021-04-13 DIAGNOSIS — Z85038 Personal history of other malignant neoplasm of large intestine: Secondary | ICD-10-CM | POA: Diagnosis not present

## 2021-04-13 DIAGNOSIS — Z0000 Encounter for general adult medical examination without abnormal findings: Secondary | ICD-10-CM | POA: Diagnosis not present

## 2021-04-13 DIAGNOSIS — M150 Primary generalized (osteo)arthritis: Secondary | ICD-10-CM | POA: Diagnosis not present

## 2021-04-13 DIAGNOSIS — Z79899 Other long term (current) drug therapy: Secondary | ICD-10-CM | POA: Diagnosis not present

## 2021-04-13 DIAGNOSIS — I10 Essential (primary) hypertension: Secondary | ICD-10-CM | POA: Diagnosis not present

## 2021-04-13 DIAGNOSIS — E1169 Type 2 diabetes mellitus with other specified complication: Secondary | ICD-10-CM | POA: Diagnosis not present

## 2021-04-13 DIAGNOSIS — Z6833 Body mass index (BMI) 33.0-33.9, adult: Secondary | ICD-10-CM | POA: Diagnosis not present

## 2021-04-13 DIAGNOSIS — E7800 Pure hypercholesterolemia, unspecified: Secondary | ICD-10-CM | POA: Diagnosis not present

## 2021-04-13 DIAGNOSIS — M810 Age-related osteoporosis without current pathological fracture: Secondary | ICD-10-CM | POA: Diagnosis not present

## 2021-04-13 DIAGNOSIS — I471 Supraventricular tachycardia: Secondary | ICD-10-CM | POA: Diagnosis not present

## 2021-04-13 DIAGNOSIS — F3342 Major depressive disorder, recurrent, in full remission: Secondary | ICD-10-CM | POA: Diagnosis not present

## 2021-04-13 DIAGNOSIS — E038 Other specified hypothyroidism: Secondary | ICD-10-CM | POA: Diagnosis not present

## 2021-04-17 DIAGNOSIS — M7121 Synovial cyst of popliteal space [Baker], right knee: Secondary | ICD-10-CM | POA: Diagnosis not present

## 2021-04-17 DIAGNOSIS — M1711 Unilateral primary osteoarthritis, right knee: Secondary | ICD-10-CM | POA: Diagnosis not present

## 2021-05-01 DIAGNOSIS — M5416 Radiculopathy, lumbar region: Secondary | ICD-10-CM | POA: Diagnosis not present

## 2021-05-01 DIAGNOSIS — G894 Chronic pain syndrome: Secondary | ICD-10-CM | POA: Diagnosis not present

## 2021-05-01 DIAGNOSIS — M79609 Pain in unspecified limb: Secondary | ICD-10-CM | POA: Diagnosis not present

## 2021-05-21 ENCOUNTER — Ambulatory Visit: Payer: Self-pay | Admitting: Psychology

## 2021-05-25 DIAGNOSIS — M1711 Unilateral primary osteoarthritis, right knee: Secondary | ICD-10-CM | POA: Diagnosis not present

## 2021-05-30 DIAGNOSIS — M546 Pain in thoracic spine: Secondary | ICD-10-CM | POA: Diagnosis not present

## 2021-06-03 DIAGNOSIS — M170 Bilateral primary osteoarthritis of knee: Secondary | ICD-10-CM | POA: Diagnosis not present

## 2021-06-03 DIAGNOSIS — M1712 Unilateral primary osteoarthritis, left knee: Secondary | ICD-10-CM | POA: Diagnosis not present

## 2021-06-05 DIAGNOSIS — M961 Postlaminectomy syndrome, not elsewhere classified: Secondary | ICD-10-CM | POA: Diagnosis not present

## 2021-06-09 ENCOUNTER — Ambulatory Visit: Payer: Self-pay | Admitting: Orthopedic Surgery

## 2021-06-11 DIAGNOSIS — Z6833 Body mass index (BMI) 33.0-33.9, adult: Secondary | ICD-10-CM | POA: Diagnosis not present

## 2021-06-11 DIAGNOSIS — K219 Gastro-esophageal reflux disease without esophagitis: Secondary | ICD-10-CM | POA: Diagnosis not present

## 2021-06-11 DIAGNOSIS — I10 Essential (primary) hypertension: Secondary | ICD-10-CM | POA: Diagnosis not present

## 2021-06-11 DIAGNOSIS — I471 Supraventricular tachycardia: Secondary | ICD-10-CM | POA: Diagnosis not present

## 2021-06-11 DIAGNOSIS — F3342 Major depressive disorder, recurrent, in full remission: Secondary | ICD-10-CM | POA: Diagnosis not present

## 2021-06-11 DIAGNOSIS — Z01810 Encounter for preprocedural cardiovascular examination: Secondary | ICD-10-CM | POA: Diagnosis not present

## 2021-06-11 DIAGNOSIS — E1169 Type 2 diabetes mellitus with other specified complication: Secondary | ICD-10-CM | POA: Diagnosis not present

## 2021-06-11 DIAGNOSIS — E7800 Pure hypercholesterolemia, unspecified: Secondary | ICD-10-CM | POA: Diagnosis not present

## 2021-06-11 DIAGNOSIS — E038 Other specified hypothyroidism: Secondary | ICD-10-CM | POA: Diagnosis not present

## 2021-06-11 DIAGNOSIS — E669 Obesity, unspecified: Secondary | ICD-10-CM | POA: Diagnosis not present

## 2021-06-30 ENCOUNTER — Ambulatory Visit: Payer: Self-pay | Admitting: Orthopedic Surgery

## 2021-06-30 NOTE — H&P (Deleted)
  The note originally documented on this encounter has been moved the the encounter in which it belongs.  

## 2021-06-30 NOTE — H&P (Signed)
Subjective:   S82CS placement C??ONE 07/08/21  Patient Active Problem List   Diagnosis Date Noted   Hypertension    Hyperlipidemia    Type 2 diabetes mellitus ([place])    Hypothyroidism    Colon cancer ([place])    AVNRT (AV nodal re-entry tachycardia) ([place])    SVT (supraventricular tachycardia) ([place]) 08/21/2014   HTN (hypertension) 08/21/2014   Elevated lipids 08/21/2014   DM (diabetes mellitus) type 2, uncontrolled, with ketoacidosis ([place]) 08/21/2014   Past Medical History:  Diagnosis Date   AVNRT (AV nodal re-entry tachycardia) ([place])    a. s/p catheter ablation on 09/30/14   Colon cancer ([place])    stage 1   Hyperlipidemia    Hypertension    Hypothyroidism    Shingles    Squamous cell carcinoma    Supraventricular tachycardia ([place])    Type 2 diabetes mellitus ([place])    Ulcerative colitis ([place])    intermittent mild flares    Past Surgical History:  Procedure Laterality Date   COLON RESECTION  2001   Partial   COLONOSCOPY     LUMBAR FUSION     SUPRAVENTRICULAR TACHYCARDIA ABLATION N/A 09/30/2014   Procedure: SUPRAVENTRICULAR TACHYCARDIA ABLATION;  Surgeon: Evans Lance, MD;  Location: [facility] C[facility] LAB;  Service: Cardiovascular;  Laterality: N/A;    Current Outpatient Medications  Medication Sig Dispense Refill Last Dose   Calcium Carbonate Antacid (CALCIUM CARBONATE PO) Take 1 tablet by mouth 2 (two) times daily with a meal.      diltiazem (DILACOR XR) 240 MG 24 hr capsule Take 240 mg by mouth daily.      Dulaglutide 3 MG/0.5ML SOPN Inject 3 mg into the skin every 7 (seven) days.      empagliflozin (JARDIANCE) 25 MG TABS tablet Take by mouth daily.      fluticasone (FLONASE) 50 MCG/ACT nasal spray Place 2 sprays into both nostrils daily as needed for allergies or rhinitis.      glimepiride (AMARYL) 4 MG tablet Take 4 mg by mouth 2 (two) times daily.       HYDROcodone-acetaminophen (NORCO/VICODIN) 5-325 MG tablet Take 1 tablet by mouth 3 (three) times daily as needed for pain.       levothyroxine (SYNTHROID, LEVOTHROID) 112 MCG tablet Take 112 mcg by mouth daily before breakfast.      meloxicam (MOBIC) 7.5 MG tablet Take 7.5 mg by mouth in the morning and at bedtime.      Multiple Vitamins-Minerals (CENTRUM SILVER PO) Take 1 tablet by mouth daily.       omeprazole (PRILOSEC) 40 MG capsule Take 40 mg by mouth at bedtime.      pregabalin (LYRICA) 150 MG capsule Take 150 mg by mouth 2 (two) times daily.      rosuvastatin (CRESTOR) 20 MG tablet Take 20 mg by mouth daily.      traMADol (ULTRAM) 50 MG tablet Take 50 mg by mouth 3 (three) times daily as needed for pain.      No current facility-administered medications for this visit.   Allergies  Allergen Reactions   Cortisone Acetate [Cortisone] Other (See Comments)    Hyperglycemia(adverse Reaction)   Metformin And Related Diarrhea   Misoprostol Diarrhea   Fosamax [Alendronate Sodium] Nausea And Vomiting   Gabapentin Nausea And Vomiting   Tape     Pulls skin off    Social History   Tobacco Use   Smoking status: Former   Smokeless tobacco: Not on file  Substance Use Topics  Alcohol use: No    Family History  Problem Relation Age of Onset   Cancer Father    Heart attack Mother    Diabetes Mother    Arrhythmia Brother    Cancer Sister    Diabetes Sister     Review of Systems Pertinent items noted in HPI and remainder of comprehensive ROS otherwise negative.  Objective:   Vitals: Ht: 5 ft 8 in 06/30/2021 01:45 pm Wt: 205 lbs 06/30/2021 01:45 pm BMI: 31.2 06/30/2021 01:45 pm BP: 120/60 06/30/2021 01:48 pm Pulse: 76 bpm 06/30/2021 01:48 pm  General: Alert and oriented 3, no apparent distress  Heart: Regular rate and rhythm, no rubs, murmurs, or gallops  Lungs: Clear to auscultation bilaterally  Abdomen: Bowel sounds 4, nondistended, nontender. No rebound tenderness. The loss of bladder or bowel control.  Peripheral pulses: 2+ dorsalis pedis/posterior tibialis pulses bilaterally.  Compartment soft and nontender.  Gait pattern: Altered gait pattern due to bilateral valgus knee deformity and pain as well as chronic low back pain. Assistive devices: Cane  Neuro: 5/5 motor strength in the lower extremity bilaterally. Negative Babinski test, no clonus, negative straight leg raise test. 1+ deep tendon reflexes at the knee and absent at the Achilles.  Musculoskeletal: Significant back pain with range of motion and deep palpation. Well-healed surgical scar from prior L4-S1 instrumented fusion with interbody fixation.  Lumbar x-rays taken today in the office (AP/lateral/spot lateral) were reviewed: Demonstrate solid L4-S1 fusion with instrumentation and interbody fixation. Patient has a stable spondylolisthesis at L5-S1. Positive calcifications within the descending vessels. No compression fracture is seen. Clinical exam: Earlie Server is a pleasant individual, who appears younger than their stated age. She is alert and orientated 3. No shortness of breath, chest pain. Abdomen is soft and non-tender, negative loss of bowel and bladder control, no rebound tenderness. Negative: skin lesions abrasions contusions  Thoracic MRI: completed on 05/30/21 was reviewed with the patient. It was completed at [facility]; I have independently reviewed the images as well as the radiology report. Mild complex scoliotic deformity but no significant canal or foraminal stenosis. Central and slightly to the left disc protrusion at T7-8 with mild cord effacement but no significant canal stenosis. No contraindication to implantation of the spinal cord stimulator.  Assessment:   [name] is a very pleasant 80 year old with who has ongoing debilitating back pain despite the prior lumbar fusion. She recently had a spinal cord stimulator trial and had significant improvement in her pain as well as quality-of-life. As a result of the successful trial she is referred to me today for permanent implantation.   I have  gone over the surgical procedure with her in great detail including the risks, benefits, and alternatives. All of her questions were encouraged and addressed.   Risks and benefits of surgery were discussed with the patient. These include: Infection, bleeding, death, stroke, paralysis, ongoing or worse pain, need for additional surgery, leak of spinal fluid, Failure of the battery requiring reoperation. Inability to place the paddle requiring the surgery to be aborted. Migration of the lead, failure to obtain results similar to the trial.  Plan:   Placement of spinal cord stimulator  We Obtained preoperative medical clearance from the patient's primary care provider.  I reviewed the patient's medication list with her. She will hold her aspirin as her Mobic.  We have also discussed the post-operative recovery period to include: bathing/showering restrictions, wound healing, activity (and driving) restrictions, medications/pain mangement.  We have also discussed post-operative redflags to include:  signs and symptoms of postoperative infection, DVT/PE.  Patient was given a copy of discharge instructions at today's office visit.  Follow-up: 2 weeks postop

## 2021-06-30 NOTE — Pre-Procedure Instructions (Signed)
Surgical Instructions    Your procedure is scheduled on Wednesday, September 14th.  Report to [facility] Main Entrance "A" at 12:30 P.M., then check in with the Admitting office.  Call this number if you have problems the morning of surgery:  [phone]   If you have any questions prior to your surgery date call [phone]: Open Monday-Friday 8am-4pm    Remember:  Do not eat after midnight the night before your surgery  You may drink clear liquids until 11:30 a.m. the morning of your surgery.   Clear liquids allowed are: Water, Non-Citrus Juices (without pulp), Carbonated Beverages, Clear Tea, Black Coffee Only, and Gatorade. (Please chose diet or sugar-free options)    Take these medicines the morning of surgery with A SIP OF WATER  diltiazem (DILACOR XR) levothyroxine (SYNTHROID, LEVOTHROID) pregabalin (LYRICA) rosuvastatin (CRESTOR)    Take these medications as needed fluticasone (FLONASE)  HYDROcodone-acetaminophen (NORCO/VICODIN) traMADol (ULTRAM)  As of today, STOP taking any Aspirin (unless otherwise instructed by your surgeon) Aleve, Naproxen, Ibuprofen, Motrin, Advil, Goody's, BC's, all herbal medications, fish oil, and all vitamins. This includes meloxicam (MOBIC).  WHAT DO I DO ABOUT MY DIABETES MEDICATION?   Do not take empagliflozin (JARDIANCE) or glimepiride (AMARYL) the morning of surgery.  Do not take empagliflozin (JARDIANCE) on Tuesday (9/13) or Wednesday (9/14).      Do not take your evening dose of glimepiride (AMARYL) the NIGHT BEFORE SURGERY, Tuesday (9/13).  The day of surgery, do not take other diabetes injectables, (dulaglutide).   HOW TO MANAGE YOUR DIABETES BEFORE AND AFTER SURGERY  Why is it important to control my blood sugar before and after surgery? Improving blood sugar levels before and after surgery helps healing and can limit problems. A way of improving blood sugar control is eating a healthy diet by:  Eating less sugar and  carbohydrates  Increasing activity/exercise  Talking with your doctor about reaching your blood sugar goals High blood sugars (greater than 180 mg/dL) can raise your risk of infections and slow your recovery, so you will need to focus on controlling your diabetes during the weeks before surgery. Make sure that the doctor who takes care of your diabetes knows about your planned surgery including the date and location.  How do I manage my blood sugar before surgery? Check your blood sugar at least 4 times a day, starting 2 days before surgery, to make sure that the level is not too high or low.  Check your blood sugar the morning of your surgery when you wake up and every 2 hours until you get to the Short Stay unit.  If your blood sugar is less than 70 mg/dL, you will need to treat for low blood sugar: Do not take insulin. Treat a low blood sugar (less than 70 mg/dL) with  cup of clear juice (cranberry or apple), 4 glucose tablets, OR glucose gel. Recheck blood sugar in 15 minutes after treatment (to make sure it is greater than 70 mg/dL). If your blood sugar is not greater than 70 mg/dL on recheck, call [phone] for further instructions. Report your blood sugar to the short stay nurse when you get to Short Stay.  If you are admitted to the hospital after surgery: Your blood sugar will be checked by the staff and you will probably be given insulin after surgery (instead of oral diabetes medicines) to make sure you have good blood sugar levels. The goal for blood sugar control after surgery is 80-180 mg/dL.  Do NOT Smoke (Tobacco/Vaping) or drink Alcohol 24 hours prior to your procedure.  If you use a CPAP at night, you may bring all equipment for your overnight stay.   Contacts, glasses, piercing's, hearing aid's, dentures or partials may not be worn into surgery, please bring cases for these belongings.    For patients admitted to the hospital, discharge time  will be determined by your treatment team.   Patients discharged the day of surgery will not be allowed to drive home, and someone needs to stay with them for 24 hours.  ONLY 1 SUPPORT PERSON MAY BE PRESENT WHILE YOU ARE IN SURGERY. IF YOU ARE TO BE ADMITTED ONCE YOU ARE IN YOUR ROOM YOU WILL BE ALLOWED TWO (2) VISITORS.  Minor children may have two parents present. Special consideration for safety and communication needs will be reviewed on a case by case basis.   Special instructions:   [place]- Preparing For Surgery  Before surgery, you can play an important role. Because skin is not sterile, your skin needs to be as free of germs as possible. You can reduce the number of germs on your skin by washing with CHG (chlorahexidine gluconate) Soap before surgery.  CHG is an antiseptic cleaner which kills germs and bonds with the skin to continue killing germs even after washing.    Oral Hygiene is also important to reduce your risk of infection.  Remember - BRUSH YOUR TEETH THE MORNING OF SURGERY WITH YOUR REGULAR TOOTHPASTE  Please do not use if you have an allergy to CHG or antibacterial soaps. If your skin becomes reddened/irritated stop using the CHG.  Do not shave (including legs and underarms) for at least 48 hours prior to first CHG shower. It is OK to shave your face.  Please follow these instructions carefully.   Shower the NIGHT BEFORE SURGERY and the MORNING OF SURGERY  If you chose to wash your hair, wash your hair first as usual with your normal shampoo.  After you shampoo, rinse your hair and body thoroughly to remove the shampoo.  Use CHG Soap as you would any other liquid soap. You can apply CHG directly to the skin and wash gently with a scrungie or a clean washcloth.   Apply the CHG Soap to your body ONLY FROM THE NECK DOWN.  Do not use on open wounds or open sores. Avoid contact with your eyes, ears, mouth and genitals (private parts). Wash Face and genitals (private  parts)  with your normal soap.   Wash thoroughly, paying special attention to the area where your surgery will be performed.  Thoroughly rinse your body with warm water from the neck down.  DO NOT shower/wash with your normal soap after using and rinsing off the CHG Soap.  Pat yourself dry with a CLEAN TOWEL.  Wear CLEAN PAJAMAS to bed the night before surgery  Place CLEAN SHEETS on your bed the night before your surgery  DO NOT SLEEP WITH PETS.   Day of Surgery: Shower with CHG soap. Do not wear jewelry, make up, nail polish, gel polish, artificial nails, or any other type of covering on natural nails including finger and toenails. If patients have artificial nails, gel coating, etc. that need to be removed by a nail salon please have this removed prior to surgery. Surgery may need to be canceled/delayed if the surgeon/ anesthesia feels like the patient is unable to be adequately monitored. Do not wear lotions, powders, perfumes, or deodorant. Do not shave  48 hours prior to surgery.   Do not bring valuables to the hospital. [facility] is not responsible for any belongings or valuables. Wear Clean/Comfortable clothing the morning of surgery Remember to brush your teeth WITH YOUR REGULAR TOOTHPASTE.   Please read over the following fact sheets that you were given.

## 2021-07-01 ENCOUNTER — Encounter (HOSPITAL_COMMUNITY)
Admission: RE | Admit: 2021-07-01 | Discharge: 2021-07-01 | Disposition: A | Discharge status: Home / Self Care | Payer: PPO | Source: Ambulatory Visit | Attending: Orthopedic Surgery | Admitting: Orthopedic Surgery

## 2021-07-01 ENCOUNTER — Encounter (HOSPITAL_COMMUNITY): Payer: Self-pay

## 2021-07-01 ENCOUNTER — Other Ambulatory Visit: Payer: Self-pay

## 2021-07-01 DIAGNOSIS — I471 Supraventricular tachycardia: Secondary | ICD-10-CM | POA: Insufficient documentation

## 2021-07-01 DIAGNOSIS — Z7984 Long term (current) use of oral hypoglycemic drugs: Secondary | ICD-10-CM | POA: Insufficient documentation

## 2021-07-01 DIAGNOSIS — Z79899 Other long term (current) drug therapy: Secondary | ICD-10-CM | POA: Insufficient documentation

## 2021-07-01 DIAGNOSIS — E118 Type 2 diabetes mellitus with unspecified complications: Secondary | ICD-10-CM | POA: Insufficient documentation

## 2021-07-01 DIAGNOSIS — G8929 Other chronic pain: Secondary | ICD-10-CM | POA: Insufficient documentation

## 2021-07-01 DIAGNOSIS — Z01818 Encounter for other preprocedural examination: Secondary | ICD-10-CM | POA: Diagnosis not present

## 2021-07-01 DIAGNOSIS — M961 Postlaminectomy syndrome, not elsewhere classified: Secondary | ICD-10-CM | POA: Diagnosis not present

## 2021-07-01 DIAGNOSIS — Z791 Long term (current) use of non-steroidal anti-inflammatories (NSAID): Secondary | ICD-10-CM | POA: Diagnosis not present

## 2021-07-01 HISTORY — DX: Cardiac arrhythmia, unspecified: I49.9

## 2021-07-01 LAB — GLUCOSE, CAPILLARY: Glucose-Capillary: 136 mg/dL — ABNORMAL HIGH (ref 70–99)

## 2021-07-01 LAB — URINALYSIS, ROUTINE W REFLEX MICROSCOPIC
Bilirubin Urine: NEGATIVE
Glucose, UA: >=500 mg/dL — AB
Hgb urine dipstick: NEGATIVE
Ketones, ur: NEGATIVE mg/dL
Leukocytes,Ua: NEGATIVE
Nitrite: NEGATIVE
Protein, ur: NEGATIVE mg/dL
Specific Gravity, Urine: <1.005 — ABNORMAL LOW (ref 1.005–1.030)
pH: 6 (ref 5.0–8.0)

## 2021-07-01 LAB — PROTIME-INR
INR: 1 (ref 0.8–1.2)
Prothrombin Time: 13 seconds (ref 11.4–15.2)

## 2021-07-01 LAB — URINALYSIS, MICROSCOPIC (REFLEX)
Bacteria, UA: NONE SEEN
RBC / HPF: NONE SEEN RBC/hpf (ref 0–5)
WBC, UA: NONE SEEN WBC/hpf (ref 0–5)

## 2021-07-01 LAB — APTT: aPTT: 32 seconds (ref 24–36)

## 2021-07-01 LAB — SURGICAL PCR SCREEN
MRSA, PCR: NEGATIVE
Staphylococcus aureus: NEGATIVE

## 2021-07-01 NOTE — Progress Notes (Signed)
PCP: Greig Right, MD Cardiologist: Crissie Sickles, MD  EKG:07/01/21 CXR: na ECHO: 2015 Stress Test: denies Cardiac Cath: denies  Fasting Blood Sugar- 150's Checks Blood Sugar_1__ times a day  OSA/CPAP: No  ASA/Blood Thinner: No  Covid test scheduled 07/06/21 at 10:45  Anesthesia Review: Yes, per order.  Patient has STV ablation, does not know when.  No follow up with cards since.  I have requested records.  Medical Clearance in chart.   Patient denies shortness of breath, fever, cough, and chest pain at PAT appointment.  Patient verbalized understanding of instructions provided today at the PAT appointment.  Patient asked to review instructions at home and day of surgery.

## 2021-07-02 NOTE — Progress Notes (Signed)
Anesthesia Chart Review:   Case: [id number] Date/Time: 07/08/21 1415   Procedure: SPINAL CORD STIMULATOR PLACEMENT - 2.5 HRS 3C-BED   Anesthesia type: General   Pre-op diagnosis: Chronic pain and failed back syndrome   Location: [facility] OR ROOM 04 / [place] [facility]   Surgeons: Melina Schools, MD       DISCUSSION: Pt is 80 years old with hx SVT, AVNRT (s/p ablation 2015), HTN, DM  VS: BP 122/76   Pulse 81   Temp 37.4 C (Oral)   Resp 18   Ht 5' 7.5" (1.715 m)   Wt 95.9 kg   SpO2 94%   BMI 32.64 kg/m   PROVIDERS: - PCP is Greig Right, MD - Used to see Cristopher Peru, MD for AVNRT, does not follow with cardiology routinely after AVNRT ablation in 2015. Last office visit 09/1514 documents prn f/u recommended   LABS: Labs reviewed: Acceptable for surgery. - HbA1c was 7.9 at PCP's office 06/11/21  (all labs ordered are listed, but only abnormal results are displayed)  Labs Reviewed  GLUCOSE, CAPILLARY - Abnormal; Notable for the following components:      Result Value   Glucose-Capillary 136 (*)    All other components within normal limits  URINALYSIS, ROUTINE W REFLEX MICROSCOPIC - Abnormal; Notable for the following components:   Specific Gravity, Urine <1.005 (*)    Glucose, UA >=500 (*)    All other components within normal limits  SURGICAL PCR SCREEN  PROTIME-INR  APTT  URINALYSIS, MICROSCOPIC (REFLEX)    EKG 07/01/21: pending from PCP's office. If it does not arrive, will obtain EKG day of surgery.    CV: None recent. Last echo 08/22/14 is in [place].    Past Medical History:  Diagnosis Date   AVNRT (AV nodal re-entry tachycardia) ([place])    a. s/p catheter ablation on 09/30/14   Colon cancer ([place])    stage 1   Dysrhythmia    Hyperlipidemia    Hypertension    Hypothyroidism    Shingles    Squamous cell carcinoma    Supraventricular tachycardia ([place])    Type 2 diabetes mellitus ([place])    Ulcerative colitis ([place])    intermittent mild flares    Past Surgical History:   Procedure Laterality Date   COLON RESECTION  2001   Partial   COLONOSCOPY     LUMBAR FUSION     SUPRAVENTRICULAR TACHYCARDIA ABLATION N/A 09/30/2014   Procedure: SUPRAVENTRICULAR TACHYCARDIA ABLATION;  Surgeon: Evans Lance, MD;  Location: [facility] CATH LAB;  Service: Cardiovascular;  Laterality: N/A;    MEDICATIONS:  Calcium Carbonate Antacid (CALCIUM CARBONATE PO)   diltiazem (DILACOR XR) 240 MG 24 hr capsule   Dulaglutide 3 MG/0.5ML SOPN   empagliflozin (JARDIANCE) 25 MG TABS tablet   fluticasone (FLONASE) 50 MCG/ACT nasal spray   glimepiride (AMARYL) 4 MG tablet   HYDROcodone-acetaminophen (NORCO/VICODIN) 5-325 MG tablet   levothyroxine (SYNTHROID, LEVOTHROID) 112 MCG tablet   meloxicam (MOBIC) 7.5 MG tablet   Multiple Vitamins-Minerals (CENTRUM SILVER PO)   omeprazole (PRILOSEC) 40 MG capsule   pregabalin (LYRICA) 150 MG capsule   rosuvastatin (CRESTOR) 20 MG tablet   traMADol (ULTRAM) 50 MG tablet   No current facility-administered medications for this encounter.    If EKG acceptable day of surgery, I anticipate pt can proceed with surgery as scheduled.  Willeen Cass, PhD, FNP-BC [facility]/Anesthesiology Phone: [phone] 07/02/2021 2:46 PM

## 2021-07-02 NOTE — Anesthesia Preprocedure Evaluation (Addendum)
Anesthesia Evaluation  Patient identified by MRN, date of birth, ID band Patient awake    Reviewed: Allergy & Precautions, NPO status , Patient's Chart, lab work & pertinent test results, reviewed documented beta blocker date and time   Airway Mallampati: II  TM Distance: >3 FB Neck ROM: Full    Dental  (+) Teeth Intact, Dental Advisory Given   Pulmonary former smoker,    Pulmonary exam normal breath sounds clear to auscultation       Cardiovascular hypertension, Pt. on medications Normal cardiovascular exam+ dysrhythmias Supra Ventricular Tachycardia  Rhythm:Regular Rate:Normal  Hx/o AV nodal re entrant tachycardia S/P ablation   Neuro/Psych Chronic pain negative psych ROS   GI/Hepatic Neg liver ROS, Hx/o ulcerative colitis   Endo/Other  diabetes, Poorly Controlled, Type 2, Oral Hypoglycemic AgentsHypothyroidism Hyperlipidemia Obesity  Renal/GU negative Renal ROS  negative genitourinary   Musculoskeletal  (+) Arthritis , Osteoarthritis,  Failed back surgery- chronic pain syndrome   Abdominal (+) + obese,   Peds  Hematology negative hematology ROS (+)   Anesthesia Other Findings   Reproductive/Obstetrics                            Anesthesia Physical Anesthesia Plan  ASA: 3  Anesthesia Plan: General   Post-op Pain Management:    Induction: Intravenous  PONV Risk Score and Plan: 4 or greater and Treatment may vary due to age or medical condition and Ondansetron  Airway Management Planned: Oral ETT  Additional Equipment:   Intra-op Plan:   Post-operative Plan: Extubation in OR  Informed Consent: I have reviewed the patients History and Physical, chart, labs and discussed the procedure including the risks, benefits and alternatives for the proposed anesthesia with the patient or authorized representative who has indicated his/her understanding and acceptance.     Dental  advisory given  Plan Discussed with: CRNA and Anesthesiologist  Anesthesia Plan Comments: (See APP note by Durel Salts, FNP )       Anesthesia Quick Evaluation

## 2021-07-06 ENCOUNTER — Other Ambulatory Visit (HOSPITAL_COMMUNITY)
Admission: RE | Admit: 2021-07-06 | Discharge: 2021-07-06 | Disposition: A | Discharge status: Home / Self Care | Payer: PPO | Source: Ambulatory Visit | Attending: Orthopedic Surgery | Admitting: Orthopedic Surgery

## 2021-07-06 DIAGNOSIS — Z01812 Encounter for preprocedural laboratory examination: Secondary | ICD-10-CM | POA: Insufficient documentation

## 2021-07-06 DIAGNOSIS — Z20822 Contact with and (suspected) exposure to covid-19: Secondary | ICD-10-CM | POA: Insufficient documentation

## 2021-07-06 LAB — SARS CORONAVIRUS 2 (TAT 6-24 HRS): SARS Coronavirus 2: NEGATIVE

## 2021-07-08 ENCOUNTER — Ambulatory Visit (HOSPITAL_COMMUNITY): Payer: PPO | Admitting: Anesthesiology

## 2021-07-08 ENCOUNTER — Ambulatory Visit (HOSPITAL_COMMUNITY): Payer: PPO

## 2021-07-08 ENCOUNTER — Ambulatory Visit (HOSPITAL_COMMUNITY): Payer: PPO | Admitting: Emergency Medicine

## 2021-07-08 ENCOUNTER — Encounter (HOSPITAL_COMMUNITY): Payer: Self-pay | Admitting: Orthopedic Surgery

## 2021-07-08 ENCOUNTER — Observation Stay (HOSPITAL_COMMUNITY)
Admission: RE | Admit: 2021-07-08 | Discharge: 2021-07-09 | Disposition: A | Discharge status: Home / Self Care | Payer: PPO | Attending: Orthopedic Surgery | Admitting: Orthopedic Surgery

## 2021-07-08 ENCOUNTER — Encounter (HOSPITAL_COMMUNITY)
Admission: RE | Disposition: A | Discharge status: Home / Self Care | Payer: Self-pay | Source: Home / Self Care | Attending: Orthopedic Surgery

## 2021-07-08 ENCOUNTER — Other Ambulatory Visit: Payer: Self-pay

## 2021-07-08 DIAGNOSIS — Z85828 Personal history of other malignant neoplasm of skin: Secondary | ICD-10-CM | POA: Diagnosis not present

## 2021-07-08 DIAGNOSIS — I471 Supraventricular tachycardia: Secondary | ICD-10-CM | POA: Diagnosis not present

## 2021-07-08 DIAGNOSIS — E785 Hyperlipidemia, unspecified: Secondary | ICD-10-CM | POA: Diagnosis not present

## 2021-07-08 DIAGNOSIS — Z7984 Long term (current) use of oral hypoglycemic drugs: Secondary | ICD-10-CM | POA: Insufficient documentation

## 2021-07-08 DIAGNOSIS — I10 Essential (primary) hypertension: Secondary | ICD-10-CM | POA: Diagnosis not present

## 2021-07-08 DIAGNOSIS — Z85038 Personal history of other malignant neoplasm of large intestine: Secondary | ICD-10-CM | POA: Insufficient documentation

## 2021-07-08 DIAGNOSIS — E119 Type 2 diabetes mellitus without complications: Secondary | ICD-10-CM | POA: Insufficient documentation

## 2021-07-08 DIAGNOSIS — Z87891 Personal history of nicotine dependence: Secondary | ICD-10-CM | POA: Insufficient documentation

## 2021-07-08 DIAGNOSIS — Z419 Encounter for procedure for purposes other than remedying health state, unspecified: Secondary | ICD-10-CM

## 2021-07-08 DIAGNOSIS — E039 Hypothyroidism, unspecified: Secondary | ICD-10-CM | POA: Diagnosis not present

## 2021-07-08 DIAGNOSIS — Z79899 Other long term (current) drug therapy: Secondary | ICD-10-CM | POA: Insufficient documentation

## 2021-07-08 DIAGNOSIS — Z4542 Encounter for adjustment and management of neuropacemaker (brain) (peripheral nerve) (spinal cord): Secondary | ICD-10-CM | POA: Diagnosis not present

## 2021-07-08 DIAGNOSIS — M961 Postlaminectomy syndrome, not elsewhere classified: Principal | ICD-10-CM | POA: Insufficient documentation

## 2021-07-08 DIAGNOSIS — G8929 Other chronic pain: Secondary | ICD-10-CM | POA: Diagnosis present

## 2021-07-08 DIAGNOSIS — G894 Chronic pain syndrome: Secondary | ICD-10-CM | POA: Insufficient documentation

## 2021-07-08 HISTORY — PX: SPINAL CORD STIMULATOR INSERTION: SHX5378

## 2021-07-08 LAB — GLUCOSE, CAPILLARY
Glucose-Capillary: 124 mg/dL — ABNORMAL HIGH (ref 70–99)
Glucose-Capillary: 134 mg/dL — ABNORMAL HIGH (ref 70–99)
Glucose-Capillary: 193 mg/dL — ABNORMAL HIGH (ref 70–99)

## 2021-07-08 SURGERY — INSERTION, SPINAL CORD STIMULATOR, LUMBAR
Anesthesia: General

## 2021-07-08 MED ORDER — FLEET ENEMA 7-19 GM/118ML RE ENEM: 1.0000 | ENEMA | Freq: Once | RECTAL | Status: DC | PRN

## 2021-07-08 MED ORDER — 0.9 % SODIUM CHLORIDE (POUR BTL) OPTIME
TOPICAL | Status: DC | PRN
  Administered 2021-07-08: 1000 mL

## 2021-07-08 MED ORDER — INSULIN ASPART 100 UNIT/ML IJ SOLN: 0.0000 [IU] | Freq: Every day | INTRAMUSCULAR | Status: DC

## 2021-07-08 MED ORDER — PROPOFOL 10 MG/ML IV BOLUS
INTRAVENOUS | Status: DC | PRN
  Administered 2021-07-08: 140 mg via INTRAVENOUS

## 2021-07-08 MED ORDER — TRANEXAMIC ACID-NACL 1000-0.7 MG/100ML-% IV SOLN
INTRAVENOUS | Status: DC | PRN
  Administered 2021-07-08: 1000 mg via INTRAVENOUS

## 2021-07-08 MED ORDER — METHOCARBAMOL 500 MG PO TABS
500.0000 mg | ORAL_TABLET | Freq: Four times a day (QID) | ORAL | Status: DC | PRN
  Administered 2021-07-08 – 2021-07-09 (×3): 500 mg via ORAL
  Filled 2021-07-08 (×2): qty 1

## 2021-07-08 MED ORDER — CHLORHEXIDINE GLUCONATE 0.12 % MT SOLN
15.0000 mL | Freq: Once | OROMUCOSAL | Status: AC
  Administered 2021-07-08: 15 mL via OROMUCOSAL
  Filled 2021-07-08: qty 15

## 2021-07-08 MED ORDER — HYDROMORPHONE HCL 1 MG/ML IJ SOLN
INTRAMUSCULAR | Status: AC
  Filled 2021-07-08: qty 1

## 2021-07-08 MED ORDER — SODIUM CHLORIDE 0.9 % IV SOLN: 250.0000 mL | INTRAVENOUS | Status: DC

## 2021-07-08 MED ORDER — TRANEXAMIC ACID-NACL 1000-0.7 MG/100ML-% IV SOLN
INTRAVENOUS | Status: AC
  Filled 2021-07-08: qty 100

## 2021-07-08 MED ORDER — SUGAMMADEX SODIUM 200 MG/2ML IV SOLN
INTRAVENOUS | Status: DC | PRN
  Administered 2021-07-08: 200 mg via INTRAVENOUS

## 2021-07-08 MED ORDER — HYDROMORPHONE HCL 1 MG/ML IJ SOLN
0.5000 mg | INTRAMUSCULAR | Status: AC | PRN
  Administered 2021-07-08: 0.5 mg via INTRAVENOUS

## 2021-07-08 MED ORDER — LIDOCAINE HCL (CARDIAC) PF 100 MG/5ML IV SOSY
PREFILLED_SYRINGE | INTRAVENOUS | Status: DC | PRN
  Administered 2021-07-08: 50 mg via INTRAVENOUS

## 2021-07-08 MED ORDER — ONDANSETRON HCL 4 MG/2ML IJ SOLN
INTRAMUSCULAR | Status: AC
  Filled 2021-07-08: qty 2

## 2021-07-08 MED ORDER — PHENYLEPHRINE 40 MCG/ML (10ML) SYRINGE FOR IV PUSH (FOR BLOOD PRESSURE SUPPORT)
PREFILLED_SYRINGE | INTRAVENOUS | Status: AC
  Filled 2021-07-08: qty 10

## 2021-07-08 MED ORDER — BUPIVACAINE-EPINEPHRINE 0.25% -1:200000 IJ SOLN
INTRAMUSCULAR | Status: DC | PRN
  Administered 2021-07-08: 20 mL

## 2021-07-08 MED ORDER — ROCURONIUM BROMIDE 100 MG/10ML IV SOLN
INTRAVENOUS | Status: DC | PRN
  Administered 2021-07-08: 60 mg via INTRAVENOUS

## 2021-07-08 MED ORDER — GLIMEPIRIDE 2 MG PO TABS
4.0000 mg | ORAL_TABLET | Freq: Two times a day (BID) | ORAL | Status: DC
  Administered 2021-07-09: 4 mg via ORAL
  Filled 2021-07-08: qty 2

## 2021-07-08 MED ORDER — OXYCODONE HCL 5 MG PO TABS
10.0000 mg | ORAL_TABLET | ORAL | Status: DC | PRN
  Administered 2021-07-08 – 2021-07-09 (×4): 10 mg via ORAL
  Filled 2021-07-08 (×3): qty 2

## 2021-07-08 MED ORDER — CEFAZOLIN SODIUM-DEXTROSE 2-4 GM/100ML-% IV SOLN
2.0000 g | INTRAVENOUS | Status: AC
  Administered 2021-07-08: 2 g via INTRAVENOUS
  Filled 2021-07-08: qty 100

## 2021-07-08 MED ORDER — CEFAZOLIN SODIUM-DEXTROSE 1-4 GM/50ML-% IV SOLN
1.0000 g | Freq: Three times a day (TID) | INTRAVENOUS | Status: AC
  Administered 2021-07-08 – 2021-07-09 (×2): 1 g via INTRAVENOUS
  Filled 2021-07-08 (×2): qty 50

## 2021-07-08 MED ORDER — SODIUM CHLORIDE 0.9% FLUSH: 3.0000 mL | Freq: Two times a day (BID) | INTRAVENOUS | Status: DC

## 2021-07-08 MED ORDER — ORAL CARE MOUTH RINSE: 15.0000 mL | Freq: Once | OROMUCOSAL | Status: AC

## 2021-07-08 MED ORDER — DEXAMETHASONE SODIUM PHOSPHATE 10 MG/ML IJ SOLN
INTRAMUSCULAR | Status: AC
  Filled 2021-07-08: qty 1

## 2021-07-08 MED ORDER — METHOCARBAMOL 500 MG PO TABS: 500.0000 mg | ORAL_TABLET | Freq: Three times a day (TID) | ORAL | 0 refills | Status: AC | PRN

## 2021-07-08 MED ORDER — ROCURONIUM BROMIDE 10 MG/ML (PF) SYRINGE
PREFILLED_SYRINGE | INTRAVENOUS | Status: AC
  Filled 2021-07-08: qty 10

## 2021-07-08 MED ORDER — LACTATED RINGERS IV SOLN: INTRAVENOUS | Status: DC

## 2021-07-08 MED ORDER — SODIUM CHLORIDE 0.9% FLUSH: 3.0000 mL | INTRAVENOUS | Status: DC | PRN

## 2021-07-08 MED ORDER — PHENYLEPHRINE 40 MCG/ML (10ML) SYRINGE FOR IV PUSH (FOR BLOOD PRESSURE SUPPORT)
PREFILLED_SYRINGE | INTRAVENOUS | Status: DC | PRN
  Administered 2021-07-08: 80 ug via INTRAVENOUS

## 2021-07-08 MED ORDER — PHENOL 1.4 % MT LIQD: 1.0000 | OROMUCOSAL | Status: DC | PRN

## 2021-07-08 MED ORDER — METHOCARBAMOL 1000 MG/10ML IJ SOLN
500.0000 mg | Freq: Four times a day (QID) | INTRAVENOUS | Status: DC | PRN
  Filled 2021-07-08: qty 5

## 2021-07-08 MED ORDER — METHOCARBAMOL 500 MG PO TABS
ORAL_TABLET | ORAL | Status: AC
  Filled 2021-07-08: qty 1

## 2021-07-08 MED ORDER — INSULIN ASPART 100 UNIT/ML IJ SOLN
0.0000 [IU] | Freq: Three times a day (TID) | INTRAMUSCULAR | Status: DC
  Administered 2021-07-09: 2 [IU] via SUBCUTANEOUS

## 2021-07-08 MED ORDER — ACETAMINOPHEN 325 MG PO TABS
650.0000 mg | ORAL_TABLET | ORAL | Status: DC | PRN
  Administered 2021-07-08 – 2021-07-09 (×2): 650 mg via ORAL
  Filled 2021-07-08 (×2): qty 2

## 2021-07-08 MED ORDER — OXYCODONE HCL 5 MG PO TABS: 5.0000 mg | ORAL_TABLET | ORAL | Status: DC | PRN

## 2021-07-08 MED ORDER — ACETAMINOPHEN 650 MG RE SUPP: 650.0000 mg | RECTAL | Status: DC | PRN

## 2021-07-08 MED ORDER — HYDROCODONE-ACETAMINOPHEN 10-325 MG PO TABS: 1.0000 | ORAL_TABLET | Freq: Four times a day (QID) | ORAL | 0 refills | Status: AC | PRN

## 2021-07-08 MED ORDER — OXYCODONE HCL 5 MG PO TABS
ORAL_TABLET | ORAL | Status: AC
  Filled 2021-07-08: qty 2

## 2021-07-08 MED ORDER — POLYETHYLENE GLYCOL 3350 17 G PO PACK: 17.0000 g | PACK | Freq: Every day | ORAL | Status: DC | PRN

## 2021-07-08 MED ORDER — LEVOTHYROXINE SODIUM 112 MCG PO TABS
112.0000 ug | ORAL_TABLET | Freq: Every day | ORAL | Status: DC
  Administered 2021-07-09: 112 ug via ORAL
  Filled 2021-07-08: qty 1

## 2021-07-08 MED ORDER — PREGABALIN 75 MG PO CAPS
150.0000 mg | ORAL_CAPSULE | Freq: Two times a day (BID) | ORAL | Status: DC
  Administered 2021-07-08 – 2021-07-09 (×2): 150 mg via ORAL
  Filled 2021-07-08 (×2): qty 2

## 2021-07-08 MED ORDER — FENTANYL CITRATE (PF) 250 MCG/5ML IJ SOLN
INTRAMUSCULAR | Status: DC | PRN
  Administered 2021-07-08 (×2): 50 ug via INTRAVENOUS

## 2021-07-08 MED ORDER — DILTIAZEM HCL ER COATED BEADS 240 MG PO CP24
240.0000 mg | ORAL_CAPSULE | Freq: Every day | ORAL | Status: DC
  Administered 2021-07-09: 240 mg via ORAL
  Filled 2021-07-08 (×2): qty 1

## 2021-07-08 MED ORDER — ONDANSETRON HCL 4 MG PO TABS: 4.0000 mg | ORAL_TABLET | Freq: Three times a day (TID) | ORAL | 0 refills | Status: DC | PRN

## 2021-07-08 MED ORDER — FENTANYL CITRATE (PF) 250 MCG/5ML IJ SOLN
INTRAMUSCULAR | Status: AC
  Filled 2021-07-08: qty 5

## 2021-07-08 MED ORDER — LIDOCAINE 2% (20 MG/ML) 5 ML SYRINGE
INTRAMUSCULAR | Status: AC
  Filled 2021-07-08: qty 5

## 2021-07-08 MED ORDER — ONDANSETRON HCL 4 MG/2ML IJ SOLN: 4.0000 mg | Freq: Four times a day (QID) | INTRAMUSCULAR | Status: DC | PRN

## 2021-07-08 MED ORDER — MENTHOL 3 MG MT LOZG: 1.0000 | LOZENGE | OROMUCOSAL | Status: DC | PRN

## 2021-07-08 MED ORDER — PROPOFOL 10 MG/ML IV BOLUS
INTRAVENOUS | Status: AC
  Filled 2021-07-08: qty 20

## 2021-07-08 MED ORDER — ONDANSETRON HCL 4 MG PO TABS: 4.0000 mg | ORAL_TABLET | Freq: Four times a day (QID) | ORAL | Status: DC | PRN

## 2021-07-08 MED ORDER — ONDANSETRON HCL 4 MG/2ML IJ SOLN
INTRAMUSCULAR | Status: DC | PRN
  Administered 2021-07-08: 4 mg via INTRAVENOUS

## 2021-07-08 SURGICAL SUPPLY — 61 items
AGENT HMST KT MTR STRL THRMB (HEMOSTASIS) ×1
ANCH LD SWIFT-LCK ×2 IMPLANT
ANCHOR SWIFT LOCK ×2 IMPLANT
BAG COUNTER SPONGE SURGICOUNT (BAG) IMPLANT
BAG SPNG CNTER NS LX DISP (BAG)
CANISTER SUCT 3000ML PPV (MISCELLANEOUS) ×2 IMPLANT
CLSR STERI-STRIP ANTIMIC 1/2X4 (GAUZE/BANDAGES/DRESSINGS) ×3 IMPLANT
COVER MAYO STAND STRL (DRAPES) ×6 IMPLANT
COVER PROBE W GEL 5X96 (DRAPES) IMPLANT
COVER SURGICAL LIGHT HANDLE (MISCELLANEOUS) ×2 IMPLANT
DRAIN CHANNEL 15F RND FF W/TCR (WOUND CARE) IMPLANT
DRAPE C-ARM 42X72 X-RAY (DRAPES) ×2 IMPLANT
DRAPE SURG 17X23 STRL (DRAPES) ×2 IMPLANT
DRAPE U-SHAPE 47X51 STRL (DRAPES) ×3 IMPLANT
DRSG OPSITE POSTOP 4X6 (GAUZE/BANDAGES/DRESSINGS) ×3 IMPLANT
DURAPREP 26ML APPLICATOR (WOUND CARE) ×2 IMPLANT
ELECT BLADE 4.0 EZ CLEAN MEGAD (MISCELLANEOUS)
ELECT CAUTERY BLADE 6.4 (BLADE) ×1 IMPLANT
ELECT PENCIL ROCKER SW 15FT (MISCELLANEOUS) ×2 IMPLANT
ELECT REM PT RETURN 9FT ADLT (ELECTROSURGICAL) ×2
ELECTRODE BLDE 4.0 EZ CLN MEGD (MISCELLANEOUS) IMPLANT
ELECTRODE REM PT RTRN 9FT ADLT (ELECTROSURGICAL) ×1 IMPLANT
GENERATOR PULSE PROCLAIM 5ELIT (Neuro Prosthesis/Implant) IMPLANT
GLOVE SURG ENC MOIS LTX SZ7 (GLOVE) ×2 IMPLANT
GLOVE SURG GAMMEX LF SZ8.5 (GLOVE) ×2 IMPLANT
GLOVE SURG UNDER POLY LF SZ7 (GLOVE) ×2 IMPLANT
GLOVE SURG UNDER POLY LF SZ8.5 (GLOVE) ×2 IMPLANT
GOWN STRL REUS W/ TWL LRG LVL3 (GOWN DISPOSABLE) ×2 IMPLANT
GOWN STRL REUS W/TWL 2XL LVL3 (GOWN DISPOSABLE) ×2 IMPLANT
GOWN STRL REUS W/TWL LRG LVL3 (GOWN DISPOSABLE) ×4
GUIDE ROD (INSTRUMENTS) ×1 IMPLANT
KIT BASIN OR (CUSTOM PROCEDURE TRAY) ×2 IMPLANT
KIT TURNOVER KIT B (KITS) ×2 IMPLANT
LEAD OCTRODE GEN 8CH 60CM (Lead) ×2 IMPLANT
NDL SPNL 18GX3.5 QUINCKE PK (NEEDLE) ×1 IMPLANT
NEEDLE 22X1 1/2 (OR ONLY) (NEEDLE) ×2 IMPLANT
NEEDLE SPNL 18GX3.5 QUINCKE PK (NEEDLE) ×2 IMPLANT
NS IRRIG 1000ML POUR BTL (IV SOLUTION) ×2 IMPLANT
PACK LAMINECTOMY ORTHO (CUSTOM PROCEDURE TRAY) ×2 IMPLANT
PACK UNIVERSAL I (CUSTOM PROCEDURE TRAY) ×2 IMPLANT
PAD ARMBOARD 7.5X6 YLW CONV (MISCELLANEOUS) ×6 IMPLANT
PROGRAMMER DBS W/MAGNET NMRI (MISCELLANEOUS) ×1 IMPLANT
PULSE GENERATOR PROCLAIM 5ELIT (Neuro Prosthesis/Implant) ×2 IMPLANT
SPATULA SILICONE BRAIN 10MM (MISCELLANEOUS) IMPLANT
SPONGE SURGIFOAM ABS GEL 100 (HEMOSTASIS) ×2 IMPLANT
SPONGE T-LAP 4X18 ~~LOC~~+RFID (SPONGE) ×3 IMPLANT
STAPLER VISISTAT 35W (STAPLE) IMPLANT
SURGIFLO W/THROMBIN 8M KIT (HEMOSTASIS) ×2 IMPLANT
SUT BONE WAX W31G (SUTURE) ×1 IMPLANT
SUT ETHIBOND 2 OS 4 DA (SUTURE) ×2 IMPLANT
SUT MNCRL AB 3-0 PS2 18 (SUTURE) ×4 IMPLANT
SUT VIC AB 1 CT1 18XCR BRD 8 (SUTURE) ×2 IMPLANT
SUT VIC AB 1 CT1 8-18 (SUTURE) ×4
SUT VIC AB 2-0 CT1 18 (SUTURE) ×3 IMPLANT
SYR BULB IRRIG 60ML STRL (SYRINGE) ×2 IMPLANT
SYR CONTROL 10ML LL (SYRINGE) ×2 IMPLANT
TOOL TUNNELING 20 (MISCELLANEOUS) ×1 IMPLANT
TOWEL GREEN STERILE (TOWEL DISPOSABLE) ×2 IMPLANT
TOWEL GREEN STERILE FF (TOWEL DISPOSABLE) ×2 IMPLANT
WATER STERILE IRR 1000ML POUR (IV SOLUTION) ×2 IMPLANT
YANKAUER SUCT BULB TIP NO VENT (SUCTIONS) ×2 IMPLANT

## 2021-07-08 NOTE — H&P (Signed)
Addendum H+P: No change in clinical exam since last office visit 06/30/21 Patient with ongoing back and leg pain.  Failed prior surgical intervention.  Patient had successful SCS trial and presents now for permeant implantation.  Risks/benefits/alternatives have been review and all of her questions addressed.

## 2021-07-08 NOTE — Transfer of Care (Signed)
Immediate Anesthesia Transfer of Care Note  Patient: [name]  Procedure(s) Performed: SPINAL CORD STIMULATOR PLACEMENT  Patient Location: PACU  Anesthesia Type:General  Level of Consciousness: drowsy  Airway & Oxygen Therapy: Patient Spontanous Breathing and Patient connected to face mask oxygen  Post-op Assessment: Report given to RN and Post -op Vital signs reviewed and stable  Post vital signs: Reviewed and stable  Last Vitals:  Vitals Value Taken Time  BP 157/75 07/08/21 1806  Temp    Pulse 68 07/08/21 1806  Resp 14 07/08/21 1806  SpO2 98 % 07/08/21 1806  Vitals shown include unvalidated device data.  Last Pain:  Vitals:   07/08/21 1244  TempSrc:   PainSc: 9       Patients Stated Pain Goal: 3 ([id number])  Complications: No notable events documented.

## 2021-07-08 NOTE — Brief Op Note (Signed)
07/08/2021  5:35 PM  PATIENT:  [name]  80 y.o. female  PRE-OPERATIVE DIAGNOSIS:  Chronic pain and failed back syndrome  POST-OPERATIVE DIAGNOSIS:  Chronic pain and failed back syndrome  PROCEDURE:  Procedure(s) with comments: SPINAL CORD STIMULATOR PLACEMENT (N/A) - 2.5 HRS 3C-BED  SURGEON:  Surgeon(s) and Role:    Melina Schools, MD - Primary  PHYSICIAN ASSISTANT:   ASSISTANTS: Nelson Chimes, PA   ANESTHESIA:   general  EBL:  100 mL   BLOOD ADMINISTERED:none  DRAINS: none   LOCAL MEDICATIONS USED:  MARCAINE     SPECIMEN:  No Specimen  DISPOSITION OF SPECIMEN:  N/A  COUNTS:  YES  TOURNIQUET:  * No tourniquets in log *  DICTATION: .Dragon Dictation  PLAN OF CARE: Admit for overnight observation  PATIENT DISPOSITION:  PACU - hemodynamically stable.

## 2021-07-08 NOTE — Anesthesia Procedure Notes (Signed)
Procedure Name: Intubation Date/Time: 07/08/2021 3:26 PM Performed by: Sammie Bench, CRNA Pre-anesthesia Checklist: Patient identified, Emergency Drugs available, Suction available and Patient being monitored Patient Re-evaluated:Patient Re-evaluated prior to induction Oxygen Delivery Method: Circle System Utilized Preoxygenation: Pre-oxygenation with 100% oxygen Induction Type: IV induction and Cricoid Pressure applied Ventilation: Mask ventilation without difficulty and Oral airway inserted - appropriate to patient size Laryngoscope Size: Mac and 3 Grade View: Grade II Tube type: Oral Tube size: 7.0 mm Number of attempts: 1 Airway Equipment and Method: Stylet and Oral airway Placement Confirmation: ETT inserted through vocal cords under direct vision, positive ETCO2 and breath sounds checked- equal and bilateral Secured at: 21 cm Tube secured with: Tape Dental Injury: Teeth and Oropharynx as per pre-operative assessment

## 2021-07-08 NOTE — Op Note (Signed)
OPERATIVE REPORT  DATE OF SURGERY: 07/08/2021  PATIENT NAME:  [name] MRN: [id number] DOB: Apr 24, 1941  PCP: Greig Right, MD  PRE-OPERATIVE DIAGNOSIS: Post-laminectomy syndrome.  Status post successful spinal cord stimulator trial  POST-OPERATIVE DIAGNOSIS: Same  PROCEDURE:   Implantation of permanent spinal cord stimulator  SURGEON:  Melina Schools, MD  PHYSICIAN ASSISTANT: Nelson Chimes, PA  ANESTHESIA:   General  EBL: [id number] ml   Complications: None  Implants: Abbott spine: 2 percutaneous leads (octrodes).  1 proclaim XR battery  BRIEF HISTORY: [name] is a 80 y.o. female who had previous lumbar surgery and unfortunately continues to have significant back buttock and radicular leg pain.  She recently had a spinal cord stimulator trial and had excellent improvement in quality of life and reduction in pain.  As result of the successful trial she elected to move forward with a permanent implant.  All appropriate risks, benefits, and alternatives were discussed with the patient and consent was obtained.  PROCEDURE DETAILS: Patient was brought into the operating room and was properly positioned on the operating room table.  After induction with general anesthesia the patient was endotracheally intubated.  A timeout was taken to confirm all important data: including patient, procedure, and the level. Teds, SCD's were applied.   Thoracolumbar spine was prepped and draped in a standard fashion.  Imaging was used to identify the T10 and T12 pedicle and marked out my incision site.  The incision site was then infiltrated with quarter percent Marcaine with epinephrine as was the premarked right sided gluteal battery incision site.  Sharp dissection was carried out down to the deep fascia of the thoracic wound.  Using bipolar cautery I stripped the paraspinal muscles to expose the spinous process of T10, 11, and a portion of T12.  I then placed self-retaining retractors and  took an intraoperative fluoroscopy view.  Using T12 as the last rib-bearing vertebral body I counted up to confirm the T10 level.  Once this was confirmed I then remove the inferior third of the spinous process of T10.  A laminotomy using 2 and 3 mm Kerrison rongeur was performed.  I then resected the ligamentum flavum to expose the dorsal aspect of the thecal sac.  Percutaneous lead was then obtained and gently placed.  It was advanced using AP fluoroscopy until it came to rest at the superior aspect of the T8 vertebral body.  I confirmed on the lateral view that it was properly positioned posteriorly.  I then confirmed with the representative that the lead was properly positioned in order to obtain similar results to that of the trial.  A second percutaneous lead was placed initially came to rest at approximately the cephalad third of the vertebral body of T8.  I again confirmed with the rep that it was properly positioned and I used AP and lateral fluoroscopy to confirm proper positioning.  With the leads placed I then placed the locking antistrain gates over the leads and then sutured them directly to the T11 spinous process with an Ethibond suture.  I then passed the leads around the inferior aspect of the T12 spinous process in order to aid in preventing migration.  Copious irrigation was used and I obtained hemostasis using proper electrocautery and Floseal.  A second incision was made over the right gluteal incision sharp dissection was carried out down approximately 2-1/2 cm.  A pocket was created.  The the sub-muscular guide was then placed from the thoracic to the gluteal incision  and the leads were passed.  The leads were then secured to the battery and it was tested.  The locking knots were then torqued according to manufacture standards.  Both leads were functioning normally per the rep.  The battery was then placed in the pocket and the excess lead was wrapped and placed on the undersurface of the  battery.  The battery was then secured to the deep fascia with two #1 Vicryl sutures.  At this point time I took final images to confirm that there was no duration of the leads.  Once I confirmed satisfactory position in both the AP and lateral planes we then started the closure.  Both wounds were copiously irrigated with normal saline.  Once hemostasis was confirmed the deep layers of both wounds were closed with interrupted #1 Vicryl suture.  A running 0 Vicryl suture was used in the thoracic wound to close the deep fascia and then superficial with 2-0 Vicryl suture.  The battery site 2-0 Vicryl suture layer was placed interrupted and then 3-0 Monocryl was used to reapproximate the skin edges.  Steri-Strips and dry dressings were applied and the patient was ultimately extubated transfer the PACU without incident.  The end of the case all needle and sponge counts were correct.  There were no adverse intraoperative events.  First assistant was Nelson Chimes, [place].  She was instrumental in positioning and prepping the patient for surgery.  She provided retraction for visualization and assisted in advancing the percutaneous lead.  She was also helpful with wound closure after final irrigation.  Melina Schools, MD 07/08/2021 5:27 PM

## 2021-07-09 DIAGNOSIS — G8929 Other chronic pain: Secondary | ICD-10-CM | POA: Diagnosis not present

## 2021-07-09 DIAGNOSIS — M961 Postlaminectomy syndrome, not elsewhere classified: Secondary | ICD-10-CM | POA: Diagnosis not present

## 2021-07-09 LAB — GLUCOSE, CAPILLARY
Glucose-Capillary: 212 mg/dL — ABNORMAL HIGH (ref 70–99)
Glucose-Capillary: 145 mg/dL — ABNORMAL HIGH (ref 70–99)

## 2021-07-09 LAB — HEMOGLOBIN A1C
Hgb A1c MFr Bld: 7.3 % — ABNORMAL HIGH (ref 4.8–5.6)
Mean Plasma Glucose: 162.81 mg/dL

## 2021-07-09 NOTE — Progress Notes (Signed)
Occupational Therapy Evaluation Patient Details Name: [name] MRN: [id number] DOB: 04-07-1941 Today's Date: 07/09/2021   History of Present Illness 80 y.o. female presenting for elective implantaion of permanent spinal cord stimulator s/p post-laminectomy sydrome on 06/07/2021. PMHx significant for lumbar fusion, stage I colon cancer, HLD, HTN, squamouse cell carcinoma, DMII, ulcerative colitis, dysarthria and AVNRT.   Clinical Impression   PTA patient was living alone in a private residence and was grossly Mod I with ADLs/IADLs without use of hurry cane. Patient currently presents below baseline level of function demonstrating observed ADLs with close supervision to Min A and use of RW and LB AE. OT provided education on spinal precautions, home set-up to maximize safety and independence with self-care tasks including use of RW for household mobility initially and use of tub transfer bench in tub/shower. Patient expressed verbal understanding reporting having access to necessary DME. Patient with bilateral knee OA and difficulty with sit to stand transfers this a.m. at time of OT eval requiring heavy Min A to stand from elevated EOB. Recommendation for HHOT to maximize safety and independence with self-care tasks and initial supervision/assist with ADL transfers.     Recommendations for follow up therapy are one component of a multi-disciplinary discharge planning process, led by the attending physician.  Recommendations may be updated based on patient status, additional functional criteria and insurance authorization.   Follow Up Recommendations  Home health OT;Other (comment) (Near supervision A with mobility and tub/shower transfers initially)    Equipment Recommendations  Other (comment) (Patient to obtain RW and tub bench on her own.)    Recommendations for Other Services       Precautions / Restrictions Precautions Precautions: Fall;Back Precaution Booklet Issued:  No Precaution Comments: Verbal education provided on spinal precautions. Required Braces or Orthoses:  (No brace needed) Restrictions Weight Bearing Restrictions: No      Mobility Bed Mobility Overal bed mobility: Needs Assistance Bed Mobility: Rolling;Sidelying to Sit Rolling: Supervision Sidelying to sit: Min guard       General bed mobility comments: Supervision    Transfers Overall transfer level: Needs assistance Equipment used: Rolling walker (2 wheeled) Transfers: Sit to/from [facility] Sit to Stand: Min assist Stand pivot transfers: Min guard       General transfer comment: Heavy Min A for sit to stand from slightly elevated EOB to RW.    Balance Overall balance assessment: Needs assistance Sitting-balance support: Feet supported Sitting balance-Leahy Scale: Good     Standing balance support: Bilateral upper extremity supported;During functional activity Standing balance-Leahy Scale: Fair Standing balance comment: Able to maintain static standing balance with unilateral UE support on RW. BUE support on RW with dynamic balance.                           ADL either performed or assessed with clinical judgement   ADL Overall ADL's : Needs assistance/impaired Eating/Feeding: Independent   Grooming: Set up;Sitting   Upper Body Bathing: Set up;Sitting   Lower Body Bathing: Moderate assistance;Sit to/from stand   Upper Body Dressing : Set up;Sitting Upper Body Dressing Details (indicate cue type and reason): Donned bra and overhead shirt seated EOB with set-up assist. Lower Body Dressing: Minimal assistance;Sit to/from stand Lower Body Dressing Details (indicate cue type and reason): Min A to thread LLE and maintain balance while hiking underwear/pants over hips in standing. Requires increased time/effort and cues for safety and use of AE. Toilet Transfer: Minimal assistance;RW  Toilet Transfer Details (indicate cue type and  reason): Simulated with transfer to recliner with use of RW and Min A.                 Vision Patient Visual Report: No change from baseline       Perception     Praxis      Pertinent Vitals/Pain Pain Assessment: 0-10 Pain Score: 8  Pain Location: Incisional Pain Descriptors / Indicators: Aching;Sore;Grimacing;Guarding Pain Intervention(s): Limited activity within patient's tolerance;Monitored during session;Premedicated before session     Hand Dominance Right   Extremity/Trunk Assessment Upper Extremity Assessment Upper Extremity Assessment: Generalized weakness   Lower Extremity Assessment Lower Extremity Assessment: Defer to PT evaluation   Cervical / Trunk Assessment Cervical / Trunk Assessment: Other exceptions Cervical / Trunk Exceptions: s/p spinal surgery   Communication Communication Communication: No difficulties   Cognition Arousal/Alertness: Awake/alert Behavior During Therapy: WFL for tasks assessed/performed Overall Cognitive Status: Impaired/Different from baseline Area of Impairment: Problem solving                     Memory: Decreased short-term memory       Problem Solving: Slow processing General Comments: Requires increased time to process verbal inforamation. Easily distracted.   General Comments  Clean, dry dressing at incisions.    Exercises     Shoulder Instructions      Home Living Family/patient expects to be discharged to:: Private residence Living Arrangements: Alone Available Help at Discharge: Family;Available 24 hours/day (Son lives next door) Type of Home: House Home Access: Stairs to enter Technical brewer of Steps: 2 Entrance Stairs-Rails: Right;Left;Can reach both Home Layout: One level     Bathroom Shower/Tub: Teacher, early years/pre: Handicapped height     Home Equipment: Other (comment);Adaptive equipment (Hurry cane. Other AD/DME.) Adaptive Equipment: Reacher;Sock aid;Other  (Comment) (Will have son look for sock-aid) Additional Comments: Patient has access to DME. Reports her sister recently passed away and she has access to her equipment (RW and rollator). Patient also notes being able to rent equipment from a place in town.      Prior Functioning/Environment Level of Independence: Independent with assistive device(s)        Comments: I with ADLs/IADLs with use of hurrycane.        OT Problem List: Decreased strength;Decreased activity tolerance;Impaired balance (sitting and/or standing);Decreased safety awareness;Decreased knowledge of use of DME or AE;Pain      OT Treatment/Interventions: Self-care/ADL training;Therapeutic exercise;Energy conservation;DME and/or AE instruction;Therapeutic activities;Patient/family education;Balance training    OT Goals(Current goals can be found in the care plan section) Acute Rehab OT Goals Patient Stated Goal: To return home. OT Goal Formulation: With patient Time For Goal Achievement: 07/23/21 Potential to Achieve Goals: Good  OT Frequency: Min 2X/week   Barriers to D/C:            Co-evaluation              AM-PAC OT "6 Clicks" Daily Activity     Outcome Measure Help from another person eating meals?: None Help from another person taking care of personal grooming?: A Little Help from another person toileting, which includes using toliet, bedpan, or urinal?: A Little Help from another person bathing (including washing, rinsing, drying)?: A Little Help from another person to put on and taking off regular upper body clothing?: A Little Help from another person to put on and taking off regular lower body clothing?: A Little 6 Click Score: 19  End of Session Equipment Utilized During Treatment: Surveyor, mining Communication: Mobility status  Activity Tolerance: Patient tolerated treatment well Patient left: in chair;with call bell/phone within reach  OT Visit Diagnosis: Unsteadiness on feet  (R26.81);Muscle weakness (generalized) (M62.81)                Time: [id number] OT Time Calculation (min): 25 min Charges:  OT General Charges $OT Visit: 1 Visit OT Evaluation $OT Eval Moderate Complexity: 1 Mod OT Treatments $Self Care/Home Management : 8-22 mins  [name] H. OTR/L Supplemental OT, Department of rehab services [phone]  [name] R H. 07/09/2021, 9:00 AM

## 2021-07-09 NOTE — Plan of Care (Signed)
wnl

## 2021-07-09 NOTE — Progress Notes (Signed)
    Subjective: Procedure(s) (LRB): SPINAL CORD STIMULATOR PLACEMENT (N/A) 1 Day Post-Op  Patient reports pain as 1 on 0-10 scale.  Reports decreased leg pain reports incisional back pain   Positive void Negative bowel movement Negative flatus Negative chest pain or shortness of breath  Objective: Vital signs in last 24 hours: Temp:  [98 F (36.7 C)-99.1 F (37.3 C)] 98.3 F (36.8 C) (09/15 0747) Pulse Rate:  [68-107] 107 (09/15 0747) Resp:  [13-18] 18 (09/15 0747) BP: (124-157)/(58-75) 124/63 (09/15 0747) SpO2:  [91 %-98 %] 93 % (09/15 0747) Weight:  [93 kg] 93 kg (09/14 1237)  Intake/Output from previous day: 09/14 0701 - 09/15 0700 In: 1150 [I.V.:1000; IV Piggyback:150] Out: 100 [Blood:100]  Labs: No results for input(s): WBC, RBC, HCT, PLT in the last 72 hours. No results for input(s): NA, K, CL, CO2, BUN, CREATININE, GLUCOSE, CALCIUM in the last 72 hours. No results for input(s): LABPT, INR in the last 72 hours.  Physical Exam: Neurologically intact ABD soft Intact pulses distally Dorsiflexion/Plantar flexion intact Incision: dressing C/D/I Compartment soft Body mass index is 31.63 kg/m.   Assessment/Plan: Patient stable  Continue mobilization with physical therapy Continue care  Patient doing well overall.  Spinal cord stimulator battery without any issues.  Plan on discharge to home later this morning.  Follow-up with me in 2 weeks as scheduled.  Melina Schools, MD [facility] [phone]

## 2021-07-09 NOTE — Evaluation (Signed)
Physical Therapy Evaluation Patient Details Name: SHAYNEE GINGERY MRN: [id number] DOB: 06/29/1941 Today's Date: 07/09/2021  History of Present Illness  80 y.o. female presenting for elective implantaion of permanent spinal cord stimulator s/p post-laminectomy sydrome on 06/07/2021. PMHx significant for lumbar fusion, stage I colon cancer, HLD, HTN, squamouse cell carcinoma, DMII, ulcerative colitis, dysarthria and AVNRT.  Clinical Impression  Pt presents to PT with deficits in functional mobility, gait, balance, power, endurance, pain, cognition. Pt demonstrates LE weakness and impaired balance, benefiting from BUE support of RW during all standing activity to improve balance. Pt fatigues quickly at this time but is able to tolerate household distances of ambulation. Pt declines PT recommendation for home health PT. Pt will benefit from receiving a RW at the time of discharge.       Recommendations for follow up therapy are one component of a multi-disciplinary discharge planning process, led by the attending physician.  Recommendations may be updated based on patient status, additional functional criteria and insurance authorization.  Follow Up Recommendations No PT follow up (pt declining home health PT)    Equipment Recommendations  Rolling walker with 5" wheels    Recommendations for Other Services       Precautions / Restrictions Precautions Precautions: Fall;Back Precaution Booklet Issued: Yes (comment) Precaution Comments: pt is able to verbalize back precautions Required Braces or Orthoses:  (no brace needed per orders) Restrictions Weight Bearing Restrictions: No      Mobility  Bed Mobility Overal bed mobility: Needs Assistance Bed Mobility: Rolling;Sidelying to Sit Rolling: Supervision Sidelying to sit: Min guard       General bed mobility comments: pt received and left in recliner    Transfers Overall transfer level: Needs assistance Equipment used: Rolling  walker (2 wheeled) Transfers: Sit to/from Stand Sit to Stand: Supervision Stand pivot transfers: Min guard       General transfer comment: Heavy Min A for sit to stand from slightly elevated EOB to RW.  Ambulation/Gait Ambulation/Gait assistance: Supervision Gait Distance (Feet): 100 Feet Assistive device: Rolling walker (2 wheeled) Gait Pattern/deviations: Step-through pattern;Trunk flexed Gait velocity: reduced Gait velocity interpretation: 1.31 - 2.62 ft/sec, indicative of limited community ambulator General Gait Details: pt with slowed step-through gait, increased trunk flexion over walker with fatigue  Stairs Stairs: Yes Stairs assistance: Min guard Stair Management: One rail Left;Sideways;Step to pattern Number of Stairs: 3    Wheelchair Mobility    Modified Rankin (Stroke Patients Only)       Balance Overall balance assessment: Needs assistance Sitting-balance support: No upper extremity supported;Feet supported Sitting balance-Leahy Scale: Good     Standing balance support: Bilateral upper extremity supported Standing balance-Leahy Scale: Poor Standing balance comment: reliant on UE support of RW                             Pertinent Vitals/Pain Pain Assessment: 0-10 Pain Score: 5  Pain Location: back Pain Descriptors / Indicators: Sore Pain Intervention(s): Monitored during session    Home Living Family/patient expects to be discharged to:: Private residence Living Arrangements: Alone Available Help at Discharge: Family;Available 24 hours/day (son) Type of Home: House Home Access: Stairs to enter Entrance Stairs-Rails: Right;Left;Can reach both Entrance Stairs-Number of Steps: 2 Home Layout: One level Home Equipment: Cane - single point;Adaptive equipment Additional Comments: Patient has access to DME. Reports her sister recently passed away and she has access to her equipment (RW and rollator). Patient also notes being able  to rent  equipment from a place in town.    Prior Function Level of Independence: Independent with assistive device(s)         Comments: I with ADLs/IADLs with use of hurrycane.     Hand Dominance   Dominant Hand: Right    Extremity/Trunk Assessment   Upper Extremity Assessment Upper Extremity Assessment: Generalized weakness    Lower Extremity Assessment Lower Extremity Assessment: Generalized weakness    Cervical / Trunk Assessment Cervical / Trunk Assessment: Other exceptions Cervical / Trunk Exceptions: s/p spinal surgery  Communication   Communication: No difficulties  Cognition Arousal/Alertness: Awake/alert Behavior During Therapy: WFL for tasks assessed/performed Overall Cognitive Status: Impaired/Different from baseline Area of Impairment: Problem solving;Memory                     Memory: Decreased short-term memory       Problem Solving: Slow processing General Comments: Requires increased time to process verbal inforamation. Easily distracted.      General Comments General comments (skin integrity, edema, etc.): VSS on RA    Exercises     Assessment/Plan    PT Assessment Patient needs continued PT services  PT Problem List Decreased strength;Decreased activity tolerance;Decreased balance;Decreased mobility;Decreased cognition;Decreased knowledge of use of DME;Decreased safety awareness;Decreased knowledge of precautions;Pain       PT Treatment Interventions DME instruction;Gait training;Stair training;Functional mobility training;Therapeutic activities;Therapeutic exercise;Balance training;Neuromuscular re-education;Patient/family education    PT Goals (Current goals can be found in the Care Plan section)  Acute Rehab PT Goals Patient Stated Goal: to go home PT Goal Formulation: With patient Time For Goal Achievement: 07/23/21 Potential to Achieve Goals: Good    Frequency Min 5X/week   Barriers to discharge        Co-evaluation                AM-PAC PT "6 Clicks" Mobility  Outcome Measure Help needed turning from your back to your side while in a flat bed without using bedrails?: A Little Help needed moving from lying on your back to sitting on the side of a flat bed without using bedrails?: A Little Help needed moving to and from a bed to a chair (including a wheelchair)?: A Little Help needed standing up from a chair using your arms (e.g., wheelchair or bedside chair)?: A Little Help needed to walk in hospital room?: A Little Help needed climbing 3-5 steps with a railing? : A Little 6 Click Score: 18    End of Session   Activity Tolerance: Patient tolerated treatment well Patient left: in chair;with call bell/phone within reach Nurse Communication: Mobility status PT Visit Diagnosis: Other abnormalities of gait and mobility (R26.89);Muscle weakness (generalized) (M62.81);Pain Pain - part of body:  (back)    Time: [id number] PT Time Calculation (min) (ACUTE ONLY): 15 min   Charges:   PT Evaluation $PT Eval Low Complexity: 1 Low          Zenaida Niece, PT, DPT Acute Rehabilitation Pager: [phone]   Zenaida Niece 07/09/2021, 8:54 AM

## 2021-07-10 NOTE — Anesthesia Postprocedure Evaluation (Signed)
Anesthesia Post Note  Patient: [name]  Procedure(s) Performed: [place] PLACEMENT     Patient location during evaluation: PACU Anesthesia Type: General Level of consciousness: awake and alert Pain management: pain level controlled Vital Signs Assessment: post-procedure vital signs reviewed and stable Respiratory status: spontaneous breathing, nonlabored ventilation, respiratory function stable and patient connected to nasal cannula oxygen Cardiovascular status: blood pressure returned to baseline and stable Postop Assessment: no apparent nausea or vomiting Anesthetic complications: no   No notable events documented.  Last Vitals:  Vitals:   07/09/21 0747 07/09/21 1123  BP: 124/63 122/70  Pulse: (!) 107 95  Resp: 18 18  Temp: 36.8 C 37.1 C  SpO2: 93% 90%    Last Pain:  Vitals:   07/09/21 1123  TempSrc: Oral  PainSc:                  Chanon Loney

## 2021-07-14 ENCOUNTER — Encounter (HOSPITAL_COMMUNITY): Payer: Self-pay | Admitting: Orthopedic Surgery

## 2021-07-14 NOTE — Discharge Summary (Signed)
Patient ID: [name] MRN: [id number] DOB/AGE: July 02, 1941 80 y.o.  Admit date: 07/08/2021 Discharge date: 07/09/2021  Admission Diagnoses:  Active Problems:   Chronic pain   Discharge Diagnoses:  Active Problems:   Chronic pain  status post Procedure(s): SPINAL CORD STIMULATOR PLACEMENT  Past Medical History:  Diagnosis Date   AVNRT (AV nodal re-entry tachycardia) ([place])    a. s/p catheter ablation on 09/30/14   Colon cancer ([place])    stage 1   Dysrhythmia    Hyperlipidemia    Hypertension    Hypothyroidism    Shingles    Squamous cell carcinoma    Supraventricular tachycardia ([place])    Type 2 diabetes mellitus ([facility])    Ulcerative colitis ([place])    intermittent mild flares    Surgeries: Procedure(s): SPINAL CORD STIMULATOR PLACEMENT on 07/08/2021   Consultants:   Discharged Condition: Improved  Hospital Course: [name] is an 80 y.o. female who was admitted 07/08/2021 for operative treatment of Chronic pain and failed back syndrome. Patient failed conservative treatments (please see the history and physical for the specifics) and had severe unremitting pain that affects sleep, daily activities and work/hobbies. After pre-op clearance, the patient was taken to the operating room on 07/08/2021 and underwent  Procedure(s): [place].    Patient was given perioperative antibiotics:  Anti-infectives (From admission, onward)    Start     Dose/Rate Route Frequency Ordered Stop   07/08/21 2330  ceFAZolin (ANCEF) IVPB 1 g/50 mL premix        1 g 100 mL/hr over 30 Minutes Intravenous Every 8 hours 07/08/21 1939 07/09/21 0532   07/08/21 1229  ceFAZolin (ANCEF) IVPB 2g/100 mL premix        2 g 200 mL/hr over 30 Minutes Intravenous 30 min pre-op 07/08/21 1229 07/08/21 1529        Patient was given sequential compression devices and early ambulation to prevent DVT.   Patient benefited maximally from hospital stay and there were no  complications. At the time of discharge, the patient was urinating/moving their bowels without difficulty, tolerating a regular diet, pain is controlled with oral pain medications and they have been cleared by PT/OT.   Recent vital signs: No data found.   Recent laboratory studies: No results for input(s): WBC, HGB, HCT, PLT, NA, K, CL, CO2, BUN, CREATININE, GLUCOSE, INR, CALCIUM in the last 72 hours.  Invalid input(s): PT, 2   Discharge Medications:   Allergies as of 07/09/2021       Reactions   Cortisone Acetate [cortisone] Other (See Comments)   Hyperglycemia(adverse Reaction)   Metformin And Related Diarrhea   Misoprostol Diarrhea   Fosamax [alendronate Sodium] Nausea And Vomiting   Gabapentin Nausea And Vomiting   Tape    Pulls skin off        Medication List     STOP taking these medications    CALCIUM CARBONATE PO   CENTRUM SILVER PO   HYDROcodone-acetaminophen 5-325 MG tablet Commonly known as: NORCO/VICODIN   meloxicam 7.5 MG tablet Commonly known as: MOBIC   traMADol 50 MG tablet Commonly known as: ULTRAM       TAKE these medications    diltiazem 240 MG 24 hr capsule Commonly known as: DILACOR XR Take 240 mg by mouth daily.   Dulaglutide 3 MG/0.5ML Sopn Inject 3 mg into the skin every 7 (seven) days.   empagliflozin 25 MG Tabs tablet Commonly known as: JARDIANCE Take by mouth daily.  fluticasone 50 MCG/ACT nasal spray Commonly known as: FLONASE Place 2 sprays into both nostrils daily as needed for allergies or rhinitis.   glimepiride 4 MG tablet Commonly known as: AMARYL Take 4 mg by mouth 2 (two) times daily.   levothyroxine 112 MCG tablet Commonly known as: SYNTHROID Take 112 mcg by mouth daily before breakfast.   omeprazole 40 MG capsule Commonly known as: PRILOSEC Take 40 mg by mouth at bedtime.   ondansetron 4 MG tablet Commonly known as: Zofran Take 1 tablet (4 mg total) by mouth every 8 (eight) hours as needed for nausea or  vomiting.   pregabalin 150 MG capsule Commonly known as: LYRICA Take 150 mg by mouth 2 (two) times daily.   rosuvastatin 20 MG tablet Commonly known as: CRESTOR Take 20 mg by mouth daily.       ASK your doctor about these medications    HYDROcodone-acetaminophen 10-325 MG tablet Commonly known as: Norco Take 1 tablet by mouth every 6 (six) hours as needed for up to 5 days. Ask about: Should I take this medication?   methocarbamol 500 MG tablet Commonly known as: Robaxin Take 1 tablet (500 mg total) by mouth every 8 (eight) hours as needed for up to 5 days for muscle spasms. Ask about: Should I take this medication?        Diagnostic Studies: DG THORACOLUMABAR SPINE  Result Date: 07/08/2021 CLINICAL DATA:  Spinal cord stimulator replacement. EXAM: THORACOLUMBAR SPINE 1V COMPARISON:  Chest x-ray 02/28/2006. FINDINGS: Two intraoperative fluoroscopic views of the thoracic spine. Spinal cord stimulator device in place. Fluoroscopy time 1 minutes 44 seconds, 62.53mG y. IMPRESSION: 1. Thoracic spinal cord stimulator device placement. Electronically Signed   By: Ronney Asters M.D.   On: 07/08/2021 19:10   DG C-Arm 1-60 Min-No Report  Result Date: 07/08/2021 Fluoroscopy was utilized by the requesting physician.  No radiographic interpretation.    Discharge Instructions     Incentive spirometry RT   Complete by: As directed          Discharge Plan:  discharge to home  Disposition: stable    Signed: Charlyne Petrin for [facility] PA-C [facility] [phone] 07/14/2021, 8:55 AM

## 2021-07-21 ENCOUNTER — Other Ambulatory Visit: Payer: Self-pay

## 2021-07-21 ENCOUNTER — Other Ambulatory Visit (HOSPITAL_COMMUNITY): Payer: Self-pay | Admitting: Orthopedic Surgery

## 2021-07-21 ENCOUNTER — Ambulatory Visit (HOSPITAL_COMMUNITY)
Admission: RE | Admit: 2021-07-21 | Discharge: 2021-07-21 | Disposition: A | Discharge status: Home / Self Care | Payer: PPO | Source: Ambulatory Visit | Attending: Cardiovascular Disease | Admitting: Cardiovascular Disease

## 2021-07-21 DIAGNOSIS — M79604 Pain in right leg: Secondary | ICD-10-CM | POA: Diagnosis not present

## 2021-07-21 DIAGNOSIS — M79605 Pain in left leg: Secondary | ICD-10-CM | POA: Insufficient documentation

## 2021-07-23 DIAGNOSIS — R600 Localized edema: Secondary | ICD-10-CM | POA: Diagnosis not present

## 2021-08-17 ENCOUNTER — Encounter (HOSPITAL_COMMUNITY): Payer: Self-pay | Admitting: Orthopedic Surgery

## 2021-08-20 DIAGNOSIS — R600 Localized edema: Secondary | ICD-10-CM | POA: Diagnosis not present

## 2021-08-20 DIAGNOSIS — Z23 Encounter for immunization: Secondary | ICD-10-CM | POA: Diagnosis not present

## 2021-08-28 DIAGNOSIS — M5030 Other cervical disc degeneration, unspecified cervical region: Secondary | ICD-10-CM | POA: Diagnosis not present

## 2021-08-28 DIAGNOSIS — G894 Chronic pain syndrome: Secondary | ICD-10-CM | POA: Diagnosis not present

## 2021-08-28 DIAGNOSIS — M47812 Spondylosis without myelopathy or radiculopathy, cervical region: Secondary | ICD-10-CM | POA: Diagnosis not present

## 2021-09-07 DIAGNOSIS — M1712 Unilateral primary osteoarthritis, left knee: Secondary | ICD-10-CM | POA: Diagnosis not present

## 2021-10-05 DIAGNOSIS — Z85038 Personal history of other malignant neoplasm of large intestine: Secondary | ICD-10-CM | POA: Diagnosis not present

## 2021-10-05 DIAGNOSIS — E038 Other specified hypothyroidism: Secondary | ICD-10-CM | POA: Diagnosis not present

## 2021-10-05 DIAGNOSIS — E669 Obesity, unspecified: Secondary | ICD-10-CM | POA: Diagnosis not present

## 2021-10-05 DIAGNOSIS — E1169 Type 2 diabetes mellitus with other specified complication: Secondary | ICD-10-CM | POA: Diagnosis not present

## 2021-10-05 DIAGNOSIS — Z01810 Encounter for preprocedural cardiovascular examination: Secondary | ICD-10-CM | POA: Diagnosis not present

## 2021-10-05 DIAGNOSIS — E7800 Pure hypercholesterolemia, unspecified: Secondary | ICD-10-CM | POA: Diagnosis not present

## 2021-10-05 DIAGNOSIS — F3342 Major depressive disorder, recurrent, in full remission: Secondary | ICD-10-CM | POA: Diagnosis not present

## 2021-10-05 DIAGNOSIS — Z6833 Body mass index (BMI) 33.0-33.9, adult: Secondary | ICD-10-CM | POA: Diagnosis not present

## 2021-10-05 DIAGNOSIS — M150 Primary generalized (osteo)arthritis: Secondary | ICD-10-CM | POA: Diagnosis not present

## 2021-10-05 DIAGNOSIS — I10 Essential (primary) hypertension: Secondary | ICD-10-CM | POA: Diagnosis not present

## 2021-10-08 DIAGNOSIS — M1712 Unilateral primary osteoarthritis, left knee: Secondary | ICD-10-CM | POA: Diagnosis not present

## 2021-10-20 NOTE — Progress Notes (Signed)
Requested surgery orders via Epic inbox.

## 2021-10-29 DIAGNOSIS — G894 Chronic pain syndrome: Secondary | ICD-10-CM | POA: Diagnosis not present

## 2021-10-29 DIAGNOSIS — M47812 Spondylosis without myelopathy or radiculopathy, cervical region: Secondary | ICD-10-CM | POA: Diagnosis not present

## 2021-10-29 DIAGNOSIS — M5030 Other cervical disc degeneration, unspecified cervical region: Secondary | ICD-10-CM | POA: Diagnosis not present

## 2021-11-02 NOTE — Patient Instructions (Addendum)
DUE TO COVID-19 ONLY ONE VISITOR IS ALLOWED TO COME WITH YOU AND STAY IN THE WAITING ROOM ONLY DURING PRE OP AND PROCEDURE.   **NO VISITORS ARE ALLOWED IN THE SHORT STAY AREA OR RECOVERY ROOM!!**  IF YOU WILL BE ADMITTED INTO THE HOSPITAL YOU ARE ALLOWED ONLY TWO SUPPORT PEOPLE DURING VISITATION HOURS ONLY (7 AM -8PM)   The support person(s) must pass our screening, gel in and out, and wear a mask at all times, including in the patients room. Patients must also wear a mask when staff or their support person are in the room. Visitors GUEST BADGE MUST BE WORN VISIBLY  One adult visitor may remain with you overnight and MUST be in the room by 8 P.M.  No visitors under the age of 25. Any visitor under the age of 16 must be accompanied by an adult.    COVID SWAB TESTING MUST BE COMPLETED ON:  11/09/21 @ 12:45 PM   Site: [facility] [place] [place] Enter: Main Entrance have a seat in the waiting area to the right of main entrance (DO NOT [place]!!!!!) Dial: [phone] to alert staff you have arrived  You are not required to quarantine, however you are required to wear a well-fitted mask when you are out and around people not in your household.  Hand Hygiene often Do NOT share personal items Notify your provider if you are in close contact with someone who has COVID or you develop fever 100.4 or greater, new onset of sneezing, cough, sore throat, shortness of breath or body aches.       Your procedure is scheduled on: 11/11/21   Report to [facility] Main Entrance    Report to admitting at 8:15 AM   Call this number if you have problems the morning of surgery [phone]   Do not eat food :After Midnight.   May have liquids until 8:00 AM day of surgery  CLEAR LIQUID DIET  Foods Allowed                                                                     Foods Excluded  Water, Black Coffee and tea (no milk or creamer)           liquids  that you cannot  Plain Jell-O in any flavor  (No red)                                    see through such as: Fruit ices (not with fruit pulp)                                            milk, soups, orange juice              Iced Popsicles (No red)  All solid food                                   Apple juices  Sports drinks like Gatorade (No red) Lightly seasoned clear broth or consume(fat free) Sugar   Oral Hygiene is also important to reduce your risk of infection.                                    Remember - BRUSH YOUR TEETH THE MORNING OF SURGERY WITH YOUR REGULAR TOOTHPASTE   Take these medicines the morning of surgery with A SIP OF WATER: Diltiazem, Synthroid, Lyrica, Crestor  DO NOT TAKE ANY ORAL DIABETIC MEDICATIONS DAY OF YOUR SURGERY  How to Manage Your Diabetes Before and After Surgery  Why is it important to control my blood sugar before and after surgery? Improving blood sugar levels before and after surgery helps healing and can limit problems. A way of improving blood sugar control is eating a healthy diet by:  Eating less sugar and carbohydrates  Increasing activity/exercise  Talking with your doctor about reaching your blood sugar goals High blood sugars (greater than 180 mg/dL) can raise your risk of infections and slow your recovery, so you will need to focus on controlling your diabetes during the weeks before surgery. Make sure that the doctor who takes care of your diabetes knows about your planned surgery including the date and location.  How do I manage my blood sugar before surgery? Check your blood sugar at least 4 times a day, starting 2 days before surgery, to make sure that the level is not too high or low. Check your blood sugar the morning of your surgery when you wake up and every 2 hours until you get to the Short Stay unit. If your blood sugar is less than 70 mg/dL, you will need to treat for low blood  sugar: Do not take insulin. Treat a low blood sugar (less than 70 mg/dL) with  cup of clear juice (cranberry or apple), 4 glucose tablets, OR glucose gel. Recheck blood sugar in 15 minutes after treatment (to make sure it is greater than 70 mg/dL). If your blood sugar is not greater than 70 mg/dL on recheck, call [phone] for further instructions. Report your blood sugar to the short stay nurse when you get to Short Stay.  If you are admitted to the hospital after surgery: Your blood sugar will be checked by the staff and you will probably be given insulin after surgery (instead of oral diabetes medicines) to make sure you have good blood sugar levels. The goal for blood sugar control after surgery is 80-180 mg/dL.   WHAT DO I DO ABOUT MY DIABETES MEDICATION?  Do not take oral diabetes medicines (pills) the morning of surgery.  THE DAY BEFORE SURGERY, take only morning dose of Glimepiride, no evening dose. Do not take Jardiance.       THE MORNING OF SURGERY, do not take Glimepiride or Jardiance  The day of surgery, do not take other diabetes injectables, including Byetta (exenatide), Bydureon (exenatide ER), Victoza (liraglutide), or Trulicity (dulaglutide).   Reviewed and Endorsed by [facility] Patient Education Committee, August 2015  You may not have any metal on your body including hair pins, jewelry, and body piercing             Do not wear make-up, lotions, powders, perfumes, or deodorant  Do not wear nail polish including gel and S&S, artificial/acrylic nails, or any other type of covering on natural nails including finger and toenails. If you have artificial nails, gel coating, etc. that needs to be removed by a nail salon please have this removed prior to surgery or surgery may need to be canceled/ delayed if the surgeon/ anesthesia feels like they are unable to be safely monitored.   Do not shave  48 hours prior to surgery.    Do not  bring valuables to the hospital. [place].   Contacts, dentures or bridgework may not be worn into surgery.   Bring small overnight bag day of surgery.   Special Instructions: Bring a copy of your healthcare power of attorney and living will documents         the day of surgery if you haven't scanned them before.              Please read over the following fact sheets you were given: IF YOU HAVE QUESTIONS ABOUT YOUR PRE-OP INSTRUCTIONS PLEASE CALL [place] - Preparing for Surgery Before surgery, you can play an important role.  Because skin is not sterile, your skin needs to be as free of germs as possible.  You can reduce the number of germs on your skin by washing with CHG (chlorahexidine gluconate) soap before surgery.  CHG is an antiseptic cleaner which kills germs and bonds with the skin to continue killing germs even after washing. Please DO NOT use if you have an allergy to CHG or antibacterial soaps.  If your skin becomes reddened/irritated stop using the CHG and inform your nurse when you arrive at Short Stay. Do not shave (including legs and underarms) for at least 48 hours prior to the first CHG shower.  You may shave your face/neck.  Please follow these instructions carefully:  1.  Shower with CHG Soap the night before surgery and the  morning of surgery.  2.  If you choose to wash your hair, wash your hair first as usual with your normal  shampoo.  3.  After you shampoo, rinse your hair and body thoroughly to remove the shampoo.                             4.  Use CHG as you would any other liquid soap.  You can apply chg directly to the skin and wash.  Gently with a scrungie or clean washcloth.  5.  Apply the CHG Soap to your body ONLY FROM THE NECK DOWN.   Do   not use on face/ open                           Wound or open sores. Avoid contact with eyes, ears mouth and   genitals (private parts).                        Wash face,  Genitals (private parts) with your normal soap.  6.  Wash thoroughly, paying special attention to the area where your    surgery  will be performed.  7.  Thoroughly rinse your body with warm water from the neck down.  8.  DO NOT shower/wash with your normal soap after using and rinsing off the CHG Soap.                9.  Pat yourself dry with a clean towel.            10.  Wear clean pajamas.            11.  Place clean sheets on your bed the night of your first shower and do not  sleep with pets. Day of Surgery : Do not apply any lotions/deodorants the morning of surgery.  Please wear clean clothes to the hospital/surgery center.  FAILURE TO FOLLOW THESE INSTRUCTIONS MAY RESULT IN THE CANCELLATION OF YOUR SURGERY  PATIENT SIGNATURE_________________________________  NURSE SIGNATURE__________________________________  ________________________________________________________________________

## 2021-11-02 NOTE — Progress Notes (Signed)
Please place orders for PAT appointment scheduled 11/03/21

## 2021-11-02 NOTE — Progress Notes (Addendum)
COVID swab appointment: 11/09/21 @1245   COVID Vaccine Completed: yes x3 Date COVID Vaccine completed: Has received booster: COVID vaccine manufacturer: [place]   Date of COVID positive in last 90 days: no  PCP - Greig Right, MD Cardiologist - n/a  Clearance by Chrystie Nose 10/07/21 on chart  Chest x-ray - n/a EKG - 06/11/21 on chart Stress Test - n/a ECHO - 08/22/14 Epic Cardiac Cath - ablation 5 years ago per pt Pacemaker/ICD device last checked: n/a Spinal Cord Stimulator: yes, pt will bring day of surgery. Patient calling Maggie (rep) night before surgery to put in surgery mode  Sleep Study - n/a CPAP -   Fasting Blood Sugar - 80-200 Checks Blood Sugar _1_ times a day  Blood Thinner Instructions: n/a Aspirin Instructions: Last Dose:  Activity level: Can go up a flight of stairs and perform activities of daily living without stopping and without symptoms of chest pain or shortness of breath.        Anesthesia review: SVT, HTN, DM 2, spinal cord stimulator  Patient denies shortness of breath, fever, cough and chest pain at PAT appointment   Patient verbalized understanding of instructions that were given to them at the PAT appointment. Patient was also instructed that they will need to review over the PAT instructions again at home before surgery.

## 2021-11-03 ENCOUNTER — Other Ambulatory Visit: Payer: Self-pay

## 2021-11-03 ENCOUNTER — Encounter (HOSPITAL_COMMUNITY): Payer: Self-pay

## 2021-11-03 ENCOUNTER — Encounter (HOSPITAL_COMMUNITY)
Admission: RE | Admit: 2021-11-03 | Discharge: 2021-11-03 | Disposition: A | Discharge status: Home / Self Care | Payer: PPO | Source: Ambulatory Visit | Attending: Specialist | Admitting: Specialist

## 2021-11-03 VITALS — BP 115/69 | HR 71 | Temp 98.0°F | Resp 18 | Ht 64.0 in | Wt 210.0 lb

## 2021-11-03 DIAGNOSIS — I10 Essential (primary) hypertension: Secondary | ICD-10-CM | POA: Diagnosis not present

## 2021-11-03 DIAGNOSIS — Z01812 Encounter for preprocedural laboratory examination: Secondary | ICD-10-CM | POA: Diagnosis not present

## 2021-11-03 DIAGNOSIS — Z01818 Encounter for other preprocedural examination: Secondary | ICD-10-CM

## 2021-11-03 DIAGNOSIS — E119 Type 2 diabetes mellitus without complications: Secondary | ICD-10-CM | POA: Insufficient documentation

## 2021-11-03 HISTORY — DX: Unspecified osteoarthritis, unspecified site: M19.90

## 2021-11-03 LAB — BASIC METABOLIC PANEL
Anion gap: 10 (ref 5–15)
BUN: 15 mg/dL (ref 8–23)
CO2: 27 mmol/L (ref 22–32)
Calcium: 8.9 mg/dL (ref 8.9–10.3)
Chloride: 104 mmol/L (ref 98–111)
Creatinine, Ser: 0.64 mg/dL (ref 0.44–1.00)
GFR, Estimated: >60 mL/min (ref >60)
Glucose, Bld: 101 mg/dL — ABNORMAL HIGH (ref 70–99)
Potassium: 3.9 mmol/L (ref 3.5–5.1)
Sodium: 141 mmol/L (ref 135–145)

## 2021-11-03 LAB — CBC
HCT: 47.2 % — ABNORMAL HIGH (ref 36.0–46.0)
Hemoglobin: 14.9 g/dL (ref 12.0–15.0)
MCH: 28.8 pg (ref 26.0–34.0)
MCHC: 31.6 g/dL (ref 30.0–36.0)
MCV: 91.1 fL (ref 80.0–100.0)
Platelets: 199 10*3/uL (ref 150–400)
RBC: 5.18 MIL/uL — ABNORMAL HIGH (ref 3.87–5.11)
RDW: 14.2 % (ref 11.5–15.5)
WBC: 5.7 10*3/uL (ref 4.0–10.5)
nRBC: 0 % (ref 0.0–0.2)

## 2021-11-03 LAB — GLUCOSE, CAPILLARY: Glucose-Capillary: 101 mg/dL — ABNORMAL HIGH (ref 70–99)

## 2021-11-03 LAB — SURGICAL PCR SCREEN
MRSA, PCR: NEGATIVE
Staphylococcus aureus: NEGATIVE

## 2021-11-04 NOTE — H&P (Signed)
TOTAL KNEE ADMISSION H&P  Patient is being admitted for left total knee arthroplasty.  Subjective:  Chief Complaint:left knee pain.  HPI: [name], 81 y.o. female, has a history of pain and functional disability in the left knee due to arthritis and has failed non-surgical conservative treatments for greater than 12 weeks to includeNSAID's and/or analgesics, corticosteriod injections, and viscosupplementation injections.  Onset of symptoms was gradual, starting 6 years ago with gradually worsening course since that time. The patient noted prior procedures on the knee to include  arthroscopy and menisectomy on the left knee(s).  Patient currently rates pain in the left knee(s) at 6 out of 10 with activity. Patient has night pain, worsening of pain with activity and weight bearing, pain that interferes with activities of daily living, pain with passive range of motion, crepitus, and joint swelling.  Patient has evidence of subchondral sclerosis, periarticular osteophytes, and joint space narrowing by imaging studies. This patient has had  No previous injury . There is no active infection.  Patient Active Problem List   Diagnosis Date Noted   Chronic pain 07/08/2021   Hypertension    Hyperlipidemia    Type 2 diabetes mellitus (HCC)    Hypothyroidism    Colon cancer (HCC)    AVNRT (AV nodal re-entry tachycardia) ([place])    SVT (supraventricular tachycardia) ([place]) 08/21/2014   HTN (hypertension) 08/21/2014   Elevated lipids 08/21/2014   DM (diabetes mellitus) type 2, uncontrolled, with ketoacidosis ([place]) 08/21/2014   Past Medical History:  Diagnosis Date   Arthritis    AVNRT (AV nodal re-entry tachycardia) ([place])    a. s/p catheter ablation on 09/30/14   Colon cancer ([place])    stage 1   Dysrhythmia    Hyperlipidemia    Hypertension    Hypothyroidism    Shingles    Squamous cell carcinoma    Supraventricular tachycardia (HCC)    Type 2 diabetes mellitus ([place])    Ulcerative  colitis ([place])    intermittent mild flares    Past Surgical History:  Procedure Laterality Date   COLON RESECTION  10/26/1999   Partial   COLONOSCOPY     HIP ARTHROPLASTY Left    LUMBAR FUSION     SPINAL CORD STIMULATOR INSERTION N/A 07/08/2021   Procedure: SPINAL CORD STIMULATOR PLACEMENT;  Surgeon: Melina Schools, MD;  Location: [place];  Service: Orthopedics;  Laterality: N/A;  2.5 HRS 3C-BED   SUPRAVENTRICULAR TACHYCARDIA ABLATION N/A 09/30/2014   Procedure: SUPRAVENTRICULAR TACHYCARDIA ABLATION;  Surgeon: Evans Lance, MD;  Location: [facility] CATH LAB;  Service: Cardiovascular;  Laterality: N/A;    No current facility-administered medications for this encounter.   Current Outpatient Medications  Medication Sig Dispense Refill Last Dose   CALCIUM-VITAMIN D PO Take 1 tablet by mouth in the morning and at bedtime.      diltiazem (DILACOR XR) 240 MG 24 hr capsule Take 240 mg by mouth daily.      Dulaglutide 3 MG/0.5ML SOPN Inject 3 mg into the skin every Saturday.      empagliflozin (JARDIANCE) 25 MG TABS tablet Take 25 mg by mouth daily.      fluticasone (FLONASE) 50 MCG/ACT nasal spray Place 2 sprays into both nostrils daily as needed for allergies or rhinitis.      glimepiride (AMARYL) 4 MG tablet Take 4 mg by mouth 2 (two) times daily.       levothyroxine (SYNTHROID, LEVOTHROID) 112 MCG tablet Take 112 mcg by mouth daily before breakfast.  meloxicam (MOBIC) 7.5 MG tablet Take 7.5 mg by mouth in the morning and at bedtime.      Multiple Vitamins-Minerals (MULTIVITAMIN WITH MINERALS) tablet Take 1 tablet by mouth daily.      omeprazole (PRILOSEC) 40 MG capsule Take 40 mg by mouth at bedtime.      pregabalin (LYRICA) 150 MG capsule Take 150 mg by mouth 2 (two) times daily.      rosuvastatin (CRESTOR) 20 MG tablet Take 20 mg by mouth daily.      ondansetron (ZOFRAN) 4 MG tablet Take 1 tablet (4 mg total) by mouth every 8 (eight) hours as needed for nausea or vomiting. (Patient not  taking: Reported on 10/23/2021) 20 tablet 0 Not Taking   Allergies  Allergen Reactions   Cortisone Acetate [Cortisone] Other (See Comments)    Hyperglycemia(adverse Reaction)   Metformin And Related Diarrhea   Misoprostol Diarrhea   Fosamax [Alendronate Sodium] Nausea And Vomiting   Gabapentin Nausea And Vomiting   Tape     Pulls skin off    Social History   Tobacco Use   Smoking status: Former   Smokeless tobacco: Never  Substance Use Topics   Alcohol use: No    Family History  Problem Relation Age of Onset   Cancer Father    Heart attack Mother    Diabetes Mother    Arrhythmia Brother    Cancer Sister    Diabetes Sister      Review of Systems  All other systems reviewed and are negative.  Objective:  Physical Exam Vitals reviewed.  Constitutional:      Appearance: Normal appearance. She is normal weight.  HENT:     Head: Normocephalic and atraumatic.  Eyes:     Extraocular Movements: Extraocular movements intact.  Cardiovascular:     Rate and Rhythm: Normal rate and regular rhythm.     Pulses: Normal pulses.     Heart sounds: No murmur heard.   No friction rub. No gallop.  Pulmonary:     Effort: Pulmonary effort is normal. No respiratory distress.     Breath sounds: Normal breath sounds. No stridor. No wheezing, rhonchi or rales.  Chest:     Chest wall: No tenderness.  Musculoskeletal:        General: Swelling and tenderness (medial and lateral) present.     Cervical back: Normal range of motion and neck supple.  Skin:    General: Skin is warm and dry.     Capillary Refill: Capillary refill takes less than 2 seconds.  Neurological:     General: No focal deficit present.     Mental Status: She is alert and oriented to person, place, and time. Mental status is at baseline.  Psychiatric:        Mood and Affect: Mood normal.        Behavior: Behavior normal.        Thought Content: Thought content normal.        Judgment: Judgment normal.    Vital  signs in last 24 hours: Temp:  [98 F (36.7 C)] 98 F (36.7 C) (01/10 1050) Pulse Rate:  [71] 71 (01/10 1050) Resp:  [18] 18 (01/10 1050) BP: (115)/(69) 115/69 (01/10 1050) SpO2:  [95 %] 95 % (01/10 1050) Weight:  [95.3 kg] 95.3 kg (01/10 1050)  Labs:   Estimated body mass index is 36.05 kg/m as calculated from the following:   Height as of 11/03/21: 5\' 4"  (1.626 m).   Weight as  of 11/03/21: 95.3 kg.   Imaging Review Plain radiographs demonstrate severe degenerative joint disease of the left knee(s). The overall alignment issignificant varus. The bone quality appears to be fair for age and reported activity level.      Assessment/Plan:  End stage arthritis, left knee   The patient history, physical examination, clinical judgment of the provider and imaging studies are consistent with end stage degenerative joint disease of the left knee(s) and total knee arthroplasty is deemed medically necessary. The treatment options including medical management, injection therapy arthroscopy and arthroplasty were discussed at length. The risks and benefits of total knee arthroplasty were presented and reviewed. The risks due to aseptic loosening, infection, stiffness, patella tracking problems, thromboembolic complications and other imponderables were discussed. The patient acknowledged the explanation, agreed to proceed with the plan and consent was signed. Patient is being admitted for inpatient treatment for surgery, pain control, PT, OT, prophylactic antibiotics, VTE prophylaxis, progressive ambulation and ADL's and discharge planning. The patient is planning to be discharged to skilled nursing facility [facility] in [place].      Patient's anticipated LOS is less than 2 midnights, meeting these requirements: - Younger than 42 - Lives within 1 hour of care - Has a competent adult at home to recover with post-op recover - NO history of  - Chronic pain requiring opiods  - Diabetes  -  Coronary Artery Disease  - Heart failure  - Heart attack  - Stroke  - DVT/VTE  - Cardiac arrhythmia  - Respiratory Failure/COPD  - Renal failure  - Anemia  - Advanced Liver disease

## 2021-11-09 ENCOUNTER — Encounter (HOSPITAL_COMMUNITY)
Admission: RE | Admit: 2021-11-09 | Discharge: 2021-11-09 | Disposition: A | Discharge status: Home / Self Care | Payer: PPO | Source: Ambulatory Visit | Attending: Specialist | Admitting: Specialist

## 2021-11-09 ENCOUNTER — Other Ambulatory Visit: Payer: Self-pay

## 2021-11-09 DIAGNOSIS — Z01818 Encounter for other preprocedural examination: Secondary | ICD-10-CM

## 2021-11-09 DIAGNOSIS — Z01812 Encounter for preprocedural laboratory examination: Secondary | ICD-10-CM | POA: Insufficient documentation

## 2021-11-09 DIAGNOSIS — Z20822 Contact with and (suspected) exposure to covid-19: Secondary | ICD-10-CM | POA: Insufficient documentation

## 2021-11-10 LAB — SARS CORONAVIRUS 2 (TAT 6-24 HRS): SARS Coronavirus 2: NEGATIVE

## 2021-11-11 ENCOUNTER — Encounter (HOSPITAL_COMMUNITY): Payer: Self-pay | Admitting: Specialist

## 2021-11-11 ENCOUNTER — Ambulatory Visit (HOSPITAL_COMMUNITY): Payer: PPO | Admitting: Anesthesiology

## 2021-11-11 ENCOUNTER — Other Ambulatory Visit: Payer: Self-pay

## 2021-11-11 ENCOUNTER — Encounter (HOSPITAL_COMMUNITY)
Admission: RE | Disposition: A | Discharge status: Home / Self Care | Payer: Self-pay | Source: Home / Self Care | Attending: Specialist

## 2021-11-11 ENCOUNTER — Observation Stay (HOSPITAL_COMMUNITY)
Admission: RE | Admit: 2021-11-11 | Discharge: 2021-11-12 | Disposition: A | Discharge status: Home / Self Care | Payer: PPO | Attending: Specialist | Admitting: Specialist

## 2021-11-11 ENCOUNTER — Ambulatory Visit (HOSPITAL_COMMUNITY): Payer: PPO | Admitting: Physician Assistant

## 2021-11-11 DIAGNOSIS — G8918 Other acute postprocedural pain: Secondary | ICD-10-CM | POA: Diagnosis not present

## 2021-11-11 DIAGNOSIS — Z79899 Other long term (current) drug therapy: Secondary | ICD-10-CM | POA: Diagnosis not present

## 2021-11-11 DIAGNOSIS — Z7984 Long term (current) use of oral hypoglycemic drugs: Secondary | ICD-10-CM | POA: Insufficient documentation

## 2021-11-11 DIAGNOSIS — I10 Essential (primary) hypertension: Secondary | ICD-10-CM | POA: Insufficient documentation

## 2021-11-11 DIAGNOSIS — Z85828 Personal history of other malignant neoplasm of skin: Secondary | ICD-10-CM | POA: Diagnosis not present

## 2021-11-11 DIAGNOSIS — E039 Hypothyroidism, unspecified: Secondary | ICD-10-CM | POA: Insufficient documentation

## 2021-11-11 DIAGNOSIS — E119 Type 2 diabetes mellitus without complications: Secondary | ICD-10-CM | POA: Insufficient documentation

## 2021-11-11 DIAGNOSIS — Z87891 Personal history of nicotine dependence: Secondary | ICD-10-CM | POA: Insufficient documentation

## 2021-11-11 DIAGNOSIS — Z85038 Personal history of other malignant neoplasm of large intestine: Secondary | ICD-10-CM | POA: Insufficient documentation

## 2021-11-11 DIAGNOSIS — M1712 Unilateral primary osteoarthritis, left knee: Secondary | ICD-10-CM | POA: Diagnosis not present

## 2021-11-11 HISTORY — PX: TOTAL KNEE ARTHROPLASTY: SHX125

## 2021-11-11 LAB — COMPREHENSIVE METABOLIC PANEL
ALT: 13 U/L (ref 0–44)
AST: 18 U/L (ref 15–41)
Albumin: 4.3 g/dL (ref 3.5–5.0)
Alkaline Phosphatase: 75 U/L (ref 38–126)
Anion gap: 10 (ref 5–15)
BUN: 15 mg/dL (ref 8–23)
CO2: 23 mmol/L (ref 22–32)
Calcium: 9.4 mg/dL (ref 8.9–10.3)
Chloride: 107 mmol/L (ref 98–111)
Creatinine, Ser: 0.6 mg/dL (ref 0.44–1.00)
GFR, Estimated: >60 mL/min (ref >60)
Glucose, Bld: 103 mg/dL — ABNORMAL HIGH (ref 70–99)
Potassium: 3.7 mmol/L (ref 3.5–5.1)
Sodium: 140 mmol/L (ref 135–145)
Total Bilirubin: 1 mg/dL (ref 0.3–1.2)
Total Protein: 7.2 g/dL (ref 6.5–8.1)

## 2021-11-11 LAB — CBC
HCT: 49.8 % — ABNORMAL HIGH (ref 36.0–46.0)
Hemoglobin: 15.9 g/dL — ABNORMAL HIGH (ref 12.0–15.0)
MCH: 28.7 pg (ref 26.0–34.0)
MCHC: 31.9 g/dL (ref 30.0–36.0)
MCV: 89.9 fL (ref 80.0–100.0)
Platelets: 196 10*3/uL (ref 150–400)
RBC: 5.54 MIL/uL — ABNORMAL HIGH (ref 3.87–5.11)
RDW: 14.2 % (ref 11.5–15.5)
WBC: 6 10*3/uL (ref 4.0–10.5)
nRBC: 0 % (ref 0.0–0.2)

## 2021-11-11 LAB — TYPE AND SCREEN
ABO/RH(D): O POS
Antibody Screen: NEGATIVE

## 2021-11-11 LAB — GLUCOSE, CAPILLARY: Glucose-Capillary: 104 mg/dL — ABNORMAL HIGH (ref 70–99)

## 2021-11-11 SURGERY — ARTHROPLASTY, KNEE, TOTAL
Anesthesia: Spinal | Site: Knee | Laterality: Left

## 2021-11-11 MED ORDER — PROPOFOL 500 MG/50ML IV EMUL
INTRAVENOUS | Status: AC
  Filled 2021-11-11: qty 50

## 2021-11-11 MED ORDER — FLUTICASONE PROPIONATE 50 MCG/ACT NA SUSP
2.0000 | Freq: Every day | NASAL | Status: DC | PRN
  Filled 2021-11-11: qty 16

## 2021-11-11 MED ORDER — ORAL CARE MOUTH RINSE: 15.0000 mL | Freq: Once | OROMUCOSAL | Status: AC

## 2021-11-11 MED ORDER — GLIMEPIRIDE 4 MG PO TABS
4.0000 mg | ORAL_TABLET | Freq: Two times a day (BID) | ORAL | Status: DC
  Administered 2021-11-12: 4 mg via ORAL
  Filled 2021-11-11 (×2): qty 1

## 2021-11-11 MED ORDER — MIDAZOLAM HCL 2 MG/2ML IJ SOLN: 1.0000 mg | INTRAMUSCULAR | Status: DC

## 2021-11-11 MED ORDER — FENTANYL CITRATE PF 50 MCG/ML IJ SOSY
PREFILLED_SYRINGE | INTRAMUSCULAR | Status: AC
  Filled 2021-11-11: qty 2

## 2021-11-11 MED ORDER — LIDOCAINE HCL (PF) 2 % IJ SOLN
INTRAMUSCULAR | Status: AC
  Filled 2021-11-11: qty 5

## 2021-11-11 MED ORDER — PROPOFOL 10 MG/ML IV BOLUS
INTRAVENOUS | Status: AC
  Filled 2021-11-11: qty 20

## 2021-11-11 MED ORDER — ONDANSETRON HCL 4 MG/2ML IJ SOLN
INTRAMUSCULAR | Status: AC
  Filled 2021-11-11: qty 2

## 2021-11-11 MED ORDER — METHOCARBAMOL 500 MG IVPB - SIMPLE MED
INTRAVENOUS | Status: AC
  Filled 2021-11-11: qty 50

## 2021-11-11 MED ORDER — OXYCODONE HCL 5 MG PO TABS: 10.0000 mg | ORAL_TABLET | ORAL | Status: DC | PRN

## 2021-11-11 MED ORDER — FENTANYL CITRATE PF 50 MCG/ML IJ SOSY
PREFILLED_SYRINGE | INTRAMUSCULAR | Status: AC
  Filled 2021-11-11: qty 1

## 2021-11-11 MED ORDER — EMPAGLIFLOZIN 25 MG PO TABS
25.0000 mg | ORAL_TABLET | Freq: Every day | ORAL | Status: DC
  Administered 2021-11-12: 25 mg via ORAL
  Filled 2021-11-11: qty 1

## 2021-11-11 MED ORDER — TRAMADOL HCL 50 MG PO TABS
50.0000 mg | ORAL_TABLET | Freq: Four times a day (QID) | ORAL | Status: DC
  Administered 2021-11-11 – 2021-11-12 (×4): 50 mg via ORAL
  Filled 2021-11-11 (×4): qty 1

## 2021-11-11 MED ORDER — PROPOFOL 1000 MG/100ML IV EMUL
INTRAVENOUS | Status: AC
  Filled 2021-11-11: qty 100

## 2021-11-11 MED ORDER — 0.9 % SODIUM CHLORIDE (POUR BTL) OPTIME
TOPICAL | Status: DC | PRN
  Administered 2021-11-11: 1000 mL

## 2021-11-11 MED ORDER — LEVOTHYROXINE SODIUM 112 MCG PO TABS
112.0000 ug | ORAL_TABLET | Freq: Every day | ORAL | Status: DC
  Administered 2021-11-12: 112 ug via ORAL
  Filled 2021-11-11: qty 1

## 2021-11-11 MED ORDER — SODIUM CHLORIDE 0.9 % IR SOLN
Status: DC | PRN
  Administered 2021-11-11: 1000 mL

## 2021-11-11 MED ORDER — PREGABALIN 75 MG PO CAPS
150.0000 mg | ORAL_CAPSULE | Freq: Two times a day (BID) | ORAL | Status: DC
  Administered 2021-11-11 – 2021-11-12 (×2): 150 mg via ORAL
  Filled 2021-11-11 (×2): qty 2

## 2021-11-11 MED ORDER — LIDOCAINE 2% (20 MG/ML) 5 ML SYRINGE
INTRAMUSCULAR | Status: DC | PRN
  Administered 2021-11-11: 60 mg via INTRAVENOUS

## 2021-11-11 MED ORDER — SODIUM CHLORIDE (PF) 0.9 % IJ SOLN
INTRAMUSCULAR | Status: AC
  Filled 2021-11-11: qty 10

## 2021-11-11 MED ORDER — DIPHENHYDRAMINE HCL 12.5 MG/5ML PO ELIX: 12.5000 mg | ORAL_SOLUTION | ORAL | Status: DC | PRN

## 2021-11-11 MED ORDER — PHENYLEPHRINE 40 MCG/ML (10ML) SYRINGE FOR IV PUSH (FOR BLOOD PRESSURE SUPPORT)
PREFILLED_SYRINGE | INTRAVENOUS | Status: DC | PRN
  Administered 2021-11-11: 80 ug via INTRAVENOUS

## 2021-11-11 MED ORDER — CEFAZOLIN SODIUM-DEXTROSE 1-4 GM/50ML-% IV SOLN
1.0000 g | Freq: Three times a day (TID) | INTRAVENOUS | Status: AC
  Administered 2021-11-11 – 2021-11-12 (×3): 1 g via INTRAVENOUS
  Filled 2021-11-11 (×3): qty 50

## 2021-11-11 MED ORDER — ENOXAPARIN SODIUM 40 MG/0.4ML IJ SOSY
40.0000 mg | PREFILLED_SYRINGE | INTRAMUSCULAR | Status: DC
  Administered 2021-11-12: 40 mg via SUBCUTANEOUS
  Filled 2021-11-11: qty 0.4

## 2021-11-11 MED ORDER — DILTIAZEM HCL ER COATED BEADS 240 MG PO CP24
240.0000 mg | ORAL_CAPSULE | Freq: Every day | ORAL | Status: DC
  Administered 2021-11-12: 240 mg via ORAL
  Filled 2021-11-11: qty 1

## 2021-11-11 MED ORDER — OXYCODONE HCL 5 MG PO TABS
5.0000 mg | ORAL_TABLET | Freq: Once | ORAL | Status: AC | PRN
  Administered 2021-11-11: 5 mg via ORAL

## 2021-11-11 MED ORDER — ONDANSETRON HCL 4 MG/2ML IJ SOLN: 4.0000 mg | Freq: Once | INTRAMUSCULAR | Status: DC | PRN

## 2021-11-11 MED ORDER — HYDROMORPHONE HCL 1 MG/ML IJ SOLN
INTRAMUSCULAR | Status: AC
  Filled 2021-11-11: qty 1

## 2021-11-11 MED ORDER — FENTANYL CITRATE PF 50 MCG/ML IJ SOSY
25.0000 ug | PREFILLED_SYRINGE | INTRAMUSCULAR | Status: DC | PRN
  Administered 2021-11-11 (×3): 50 ug via INTRAVENOUS

## 2021-11-11 MED ORDER — FENTANYL CITRATE (PF) 100 MCG/2ML IJ SOLN
INTRAMUSCULAR | Status: DC | PRN
  Administered 2021-11-11 (×2): 50 ug via INTRAVENOUS
  Administered 2021-11-11: 25 ug via INTRAVENOUS
  Administered 2021-11-11 (×3): 50 ug via INTRAVENOUS
  Administered 2021-11-11: 25 ug via INTRAVENOUS

## 2021-11-11 MED ORDER — FENTANYL CITRATE (PF) 100 MCG/2ML IJ SOLN
INTRAMUSCULAR | Status: AC
  Filled 2021-11-11: qty 2

## 2021-11-11 MED ORDER — ROSUVASTATIN CALCIUM 20 MG PO TABS
20.0000 mg | ORAL_TABLET | Freq: Every day | ORAL | Status: DC
  Administered 2021-11-12: 20 mg via ORAL
  Filled 2021-11-11: qty 1

## 2021-11-11 MED ORDER — POVIDONE-IODINE 10 % EX SWAB
2.0000 "application " | Freq: Once | CUTANEOUS | Status: AC
  Administered 2021-11-11: 2 via TOPICAL

## 2021-11-11 MED ORDER — PROPOFOL 10 MG/ML IV BOLUS
INTRAVENOUS | Status: DC | PRN
Start: 2021-11-11 — End: 2021-11-11
  Administered 2021-11-11: 30 mg via INTRAVENOUS
  Administered 2021-11-11: 120 mg via INTRAVENOUS

## 2021-11-11 MED ORDER — OXYCODONE HCL 5 MG/5ML PO SOLN: 5.0000 mg | Freq: Once | ORAL | Status: AC | PRN

## 2021-11-11 MED ORDER — ONDANSETRON HCL 4 MG/2ML IJ SOLN: 4.0000 mg | Freq: Four times a day (QID) | INTRAMUSCULAR | Status: DC | PRN

## 2021-11-11 MED ORDER — METHOCARBAMOL 500 MG IVPB - SIMPLE MED
500.0000 mg | Freq: Four times a day (QID) | INTRAVENOUS | Status: DC | PRN
  Administered 2021-11-11: 500 mg via INTRAVENOUS
  Filled 2021-11-11: qty 50

## 2021-11-11 MED ORDER — ROPIVACAINE HCL 7.5 MG/ML IJ SOLN
INTRAMUSCULAR | Status: DC | PRN
  Administered 2021-11-11: 20 mL via PERINEURAL

## 2021-11-11 MED ORDER — ONDANSETRON HCL 4 MG/2ML IJ SOLN
INTRAMUSCULAR | Status: DC | PRN
Start: 2021-11-11 — End: 2021-11-11
  Administered 2021-11-11: 4 mg via INTRAVENOUS

## 2021-11-11 MED ORDER — FENTANYL CITRATE PF 50 MCG/ML IJ SOSY: 50.0000 ug | PREFILLED_SYRINGE | INTRAMUSCULAR | Status: DC

## 2021-11-11 MED ORDER — SODIUM CHLORIDE 0.9 % IV SOLN
INTRAVENOUS | Status: DC | PRN
  Administered 2021-11-11: 20 mL

## 2021-11-11 MED ORDER — ACETAMINOPHEN 500 MG PO TABS
1000.0000 mg | ORAL_TABLET | Freq: Four times a day (QID) | ORAL | Status: AC
  Administered 2021-11-11 – 2021-11-12 (×4): 1000 mg via ORAL
  Filled 2021-11-11 (×4): qty 2

## 2021-11-11 MED ORDER — BISACODYL 5 MG PO TBEC: 5.0000 mg | DELAYED_RELEASE_TABLET | Freq: Every day | ORAL | Status: DC | PRN

## 2021-11-11 MED ORDER — PROPOFOL 500 MG/50ML IV EMUL
INTRAVENOUS | Status: DC | PRN
  Administered 2021-11-11: 150 ug/kg/min via INTRAVENOUS

## 2021-11-11 MED ORDER — BUPIVACAINE LIPOSOME 1.3 % IJ SUSP
INTRAMUSCULAR | Status: AC
  Filled 2021-11-11: qty 20

## 2021-11-11 MED ORDER — SODIUM CHLORIDE (PF) 0.9 % IJ SOLN
INTRAMUSCULAR | Status: DC | PRN
  Administered 2021-11-11: 60 mL

## 2021-11-11 MED ORDER — BUPIVACAINE LIPOSOME 1.3 % IJ SUSP: 20.0000 mL | Freq: Once | INTRAMUSCULAR | Status: DC

## 2021-11-11 MED ORDER — OXYCODONE HCL 5 MG PO TABS
5.0000 mg | ORAL_TABLET | ORAL | Status: DC | PRN
  Administered 2021-11-11 – 2021-11-12 (×3): 5 mg via ORAL
  Filled 2021-11-11 (×2): qty 1

## 2021-11-11 MED ORDER — METHOCARBAMOL 500 MG PO TABS
500.0000 mg | ORAL_TABLET | Freq: Four times a day (QID) | ORAL | Status: DC | PRN
  Administered 2021-11-12: 500 mg via ORAL
  Filled 2021-11-11: qty 1

## 2021-11-11 MED ORDER — OXYCODONE HCL 5 MG PO TABS
ORAL_TABLET | ORAL | Status: AC
  Filled 2021-11-11: qty 1

## 2021-11-11 MED ORDER — SENNOSIDES-DOCUSATE SODIUM 8.6-50 MG PO TABS: 1.0000 | ORAL_TABLET | Freq: Every evening | ORAL | Status: DC | PRN

## 2021-11-11 MED ORDER — CHLORHEXIDINE GLUCONATE 0.12 % MT SOLN
15.0000 mL | Freq: Once | OROMUCOSAL | Status: AC
  Administered 2021-11-11: 15 mL via OROMUCOSAL

## 2021-11-11 MED ORDER — MIDAZOLAM HCL 2 MG/2ML IJ SOLN
INTRAMUSCULAR | Status: AC
  Filled 2021-11-11: qty 2

## 2021-11-11 MED ORDER — LACTATED RINGERS IV SOLN: INTRAVENOUS | Status: DC

## 2021-11-11 MED ORDER — CEFAZOLIN SODIUM-DEXTROSE 2-4 GM/100ML-% IV SOLN
2.0000 g | INTRAVENOUS | Status: AC
  Administered 2021-11-11: 2 g via INTRAVENOUS
  Filled 2021-11-11: qty 100

## 2021-11-11 MED ORDER — HYDROMORPHONE HCL 1 MG/ML IJ SOLN: 0.5000 mg | INTRAMUSCULAR | Status: DC | PRN

## 2021-11-11 MED ORDER — ACETAMINOPHEN 325 MG PO TABS: 325.0000 mg | ORAL_TABLET | Freq: Four times a day (QID) | ORAL | Status: DC | PRN

## 2021-11-11 MED ORDER — DEXAMETHASONE SODIUM PHOSPHATE 10 MG/ML IJ SOLN
8.0000 mg | Freq: Once | INTRAMUSCULAR | Status: AC
  Administered 2021-11-11: 4 mg via INTRAVENOUS

## 2021-11-11 MED ORDER — ACETAMINOPHEN 500 MG PO TABS: 1000.0000 mg | ORAL_TABLET | Freq: Once | ORAL | Status: DC

## 2021-11-11 MED ORDER — PANTOPRAZOLE SODIUM 40 MG PO TBEC
40.0000 mg | DELAYED_RELEASE_TABLET | Freq: Every day | ORAL | Status: DC
  Administered 2021-11-12: 40 mg via ORAL
  Filled 2021-11-11: qty 1

## 2021-11-11 MED ORDER — HYDROMORPHONE HCL 1 MG/ML IJ SOLN
0.2500 mg | INTRAMUSCULAR | Status: DC | PRN
  Administered 2021-11-11 (×3): 0.5 mg via INTRAVENOUS

## 2021-11-11 MED ORDER — TRANEXAMIC ACID-NACL 1000-0.7 MG/100ML-% IV SOLN
1000.0000 mg | INTRAVENOUS | Status: AC
  Administered 2021-11-11: 1000 mg via INTRAVENOUS
  Filled 2021-11-11: qty 100

## 2021-11-11 MED ORDER — SODIUM CHLORIDE 0.9 % IV SOLN: INTRAVENOUS | Status: DC

## 2021-11-11 MED ORDER — FENTANYL CITRATE PF 50 MCG/ML IJ SOSY
PREFILLED_SYRINGE | INTRAMUSCULAR | Status: AC
  Administered 2021-11-11: 50 ug via INTRAVENOUS
  Filled 2021-11-11: qty 2

## 2021-11-11 MED ORDER — IRRISEPT - 450ML BOTTLE WITH 0.05% CHG IN STERILE WATER, USP 99.95% OPTIME
TOPICAL | Status: DC | PRN
  Administered 2021-11-11: 450 mL via TOPICAL

## 2021-11-11 MED ORDER — STERILE WATER FOR IRRIGATION IR SOLN
Status: DC | PRN
  Administered 2021-11-11: 2000 mL

## 2021-11-11 MED ORDER — ONDANSETRON HCL 4 MG PO TABS: 4.0000 mg | ORAL_TABLET | Freq: Four times a day (QID) | ORAL | Status: DC | PRN

## 2021-11-11 SURGICAL SUPPLY — 60 items
ADH SKN CLS APL DERMABOND .7 (GAUZE/BANDAGES/DRESSINGS) ×2
ATTUNE PSFEM LTSZ6 NARCEM KNEE (Femur) ×1 IMPLANT
ATTUNE PSRP INSR SZ6 10 KNEE (Insert) ×1 IMPLANT
BAG COUNTER SPONGE SURGICOUNT (BAG) ×2 IMPLANT
BAG SPEC THK2 15X12 ZIP CLS (MISCELLANEOUS) ×1
BAG SPNG CNTER NS LX DISP (BAG) ×1
BAG ZIPLOCK 12X15 (MISCELLANEOUS) ×2 IMPLANT
BASE TIBIA ATTUNE KNEE SYS SZ6 (Knees) IMPLANT
BLADE SAG 18X100X1.27 (BLADE) ×2 IMPLANT
BLADE SAW SGTL 11.0X1.19X90.0M (BLADE) ×2 IMPLANT
BNDG ELASTIC 4X5.8 VLCR STR LF (GAUZE/BANDAGES/DRESSINGS) ×2 IMPLANT
BNDG ELASTIC 6X5.8 VLCR STR LF (GAUZE/BANDAGES/DRESSINGS) ×2 IMPLANT
BONE CEMENT GENTAMICIN (Cement) ×4 IMPLANT
BOWL SMART MIX CTS (DISPOSABLE) ×2 IMPLANT
BSPLAT TIB 6 CMNT ROT PLAT STR (Knees) ×1 IMPLANT
CEMENT BONE GENTAMICIN 40 (Cement) IMPLANT
COVER SURGICAL LIGHT HANDLE (MISCELLANEOUS) ×2 IMPLANT
CUFF TOURN SGL QUICK 34 (TOURNIQUET CUFF) ×2
CUFF TRNQT CYL 34X4.125X (TOURNIQUET CUFF) ×1 IMPLANT
DECANTER SPIKE VIAL GLASS SM (MISCELLANEOUS) ×2 IMPLANT
DERMABOND ADVANCED (GAUZE/BANDAGES/DRESSINGS) ×2
DERMABOND ADVANCED .7 DNX12 (GAUZE/BANDAGES/DRESSINGS) ×1 IMPLANT
DRAPE INCISE IOBAN 66X45 STRL (DRAPES) ×6 IMPLANT
DRAPE U-SHAPE 47X51 STRL (DRAPES) ×2 IMPLANT
DRSG AQUACEL AG ADV 3.5X10 (GAUZE/BANDAGES/DRESSINGS) ×2 IMPLANT
DURAPREP 26ML APPLICATOR (WOUND CARE) ×4 IMPLANT
ELECT REM PT RETURN 15FT ADLT (MISCELLANEOUS) ×2 IMPLANT
GLOVE SRG 8 PF TXTR STRL LF DI (GLOVE) ×1 IMPLANT
GLOVE SURG NEOPR MICRO LF SZ8 (GLOVE) ×2 IMPLANT
GLOVE SURG ORTHO LTX SZ9 (GLOVE) ×2 IMPLANT
GLOVE SURG POLYISO LF SZ7 (GLOVE) ×2 IMPLANT
GLOVE SURG UNDER POLY LF SZ7.5 (GLOVE) ×2 IMPLANT
GLOVE SURG UNDER POLY LF SZ8 (GLOVE) ×2
GOWN STRL REUS W/TWL XL LVL3 (GOWN DISPOSABLE) ×4 IMPLANT
HANDPIECE INTERPULSE COAX TIP (DISPOSABLE) ×2
KIT TURNOVER KIT A (KITS) ×1 IMPLANT
NS IRRIG 1000ML POUR BTL (IV SOLUTION) ×2 IMPLANT
PACK TOTAL KNEE CUSTOM (KITS) ×2 IMPLANT
PATELLA MEDIAL ATTUN 35MM KNEE (Knees) ×1 IMPLANT
PROTECTOR NERVE ULNAR (MISCELLANEOUS) ×2 IMPLANT
SET HNDPC FAN SPRY TIP SCT (DISPOSABLE) ×1 IMPLANT
SET PAD KNEE POSITIONER (MISCELLANEOUS) ×2 IMPLANT
SPONGE SURGIFOAM ABS GEL 100 (HEMOSTASIS) ×2 IMPLANT
SPONGE T-LAP 18X18 ~~LOC~~+RFID (SPONGE) ×2 IMPLANT
STOCKINETTE 6  STRL (DRAPES) ×2
STOCKINETTE 6 STRL (DRAPES) ×1 IMPLANT
SUCTION FRAZIER HANDLE 12FR (TUBING) ×2
SUCTION TUBE FRAZIER 12FR DISP (TUBING) IMPLANT
SUT MNCRL AB 3-0 PS2 18 (SUTURE) ×2 IMPLANT
SUT VIC AB 1 CT1 27 (SUTURE) ×6
SUT VIC AB 1 CT1 27XBRD ANTBC (SUTURE) ×3 IMPLANT
SUT VIC AB 2-0 CT1 27 (SUTURE) ×4
SUT VIC AB 2-0 CT1 TAPERPNT 27 (SUTURE) ×2 IMPLANT
SUT VLOC 180 0 24IN GS25 (SUTURE) ×2 IMPLANT
SYR 50ML LL SCALE MARK (SYRINGE) ×2 IMPLANT
TAPE STRIPS DRAPE STRL (GAUZE/BANDAGES/DRESSINGS) ×2 IMPLANT
TIBIA ATTUNE KNEE SYS BASE SZ6 (Knees) ×2 IMPLANT
TRAY CATH INTERMITTENT SS 16FR (CATHETERS) ×2 IMPLANT
WATER STERILE IRR 1000ML POUR (IV SOLUTION) ×4 IMPLANT
WRAP KNEE MAXI GEL POST OP (GAUZE/BANDAGES/DRESSINGS) ×1 IMPLANT

## 2021-11-11 NOTE — Interval H&P Note (Signed)
History and Physical Interval Note:  11/11/2021 8:34 AM  [name]  has presented today for surgery, with the diagnosis of left knee osteoarthritis.  The various methods of treatment have been discussed with the patient and family. After consideration of risks, benefits and other options for treatment, the patient has consented to  Procedure(s) with comments: TOTAL KNEE ARTHROPLASTY (Left) - with adductor canal patient has spinal cord stimulator 149min as a surgical intervention.  The patient's history has been reviewed, patient examined, no change in status, stable for surgery.  I have reviewed the patient's chart and labs.  Questions were answered to the patient's satisfaction.     Tyrea Froberg ANDREW

## 2021-11-11 NOTE — Anesthesia Preprocedure Evaluation (Addendum)
Anesthesia Evaluation  Patient identified by MRN, date of birth, ID band Patient awake    Reviewed: Allergy & Precautions, NPO status , Patient's Chart, lab work & pertinent test results  History of Anesthesia Complications Negative for: history of anesthetic complications  Airway Mallampati: III  TM Distance: >3 FB Neck ROM: Full    Dental  (+) Dental Advisory Given, Partial Lower, Partial Upper   Pulmonary former smoker,    Pulmonary exam normal        Cardiovascular hypertension, Normal cardiovascular exam+ dysrhythmias (s/p ablation) Supra Ventricular Tachycardia      Neuro/Psych  Spinal cord stimulator (lower thoracic vertebra per OP note)   negative psych ROS   GI/Hepatic Neg liver ROS, PUD,  UC Colon cancer s/p resection     Endo/Other  diabetes, Type 2, Oral Hypoglycemic AgentsHypothyroidism  Obesity   Renal/GU negative Renal ROS     Musculoskeletal  (+) Arthritis ,   Abdominal   Peds  Hematology negative hematology ROS (+)   Anesthesia Other Findings   Reproductive/Obstetrics                            Anesthesia Physical Anesthesia Plan  ASA: 3  Anesthesia Plan: Spinal   Post-op Pain Management: Regional block and Tylenol PO (pre-op)   Induction:   PONV Risk Score and Plan: 2 and Treatment may vary due to age or medical condition and Propofol infusion  Airway Management Planned: Natural Airway and Simple Face Mask  Additional Equipment: None  Intra-op Plan:   Post-operative Plan:   Informed Consent: I have reviewed the patients History and Physical, chart, labs and discussed the procedure including the risks, benefits and alternatives for the proposed anesthesia with the patient or authorized representative who has indicated his/her understanding and acceptance.       Plan Discussed with: CRNA and Anesthesiologist  Anesthesia Plan Comments: (Labs  reviewed, platelets acceptable. Lengthy discussion regarding risks and benefits of spinal especially in light of her multiple back surgeries and spinal cord stimulator, including spinal/epidural hematoma, infection, failed block, and PDPH. Patient expressed understanding and wished to proceed. )      Anesthesia Quick Evaluation

## 2021-11-11 NOTE — Care Plan (Signed)
Ortho Bundle Case Management Note  Patient Details  Name: [name] MRN: [id number] Date of Birth: 15-Jul-1941  L TKA on 11-11-21 DCP:  home with son DME:  No needs, has a RW and handicapped toilets PT:  [place] on 11-16-21.                   DME Arranged:  N/A DME Agency:  NA  HH Arranged:  NA HH Agency:  NA  Additional Comments: Please contact me with any questions of if this plan should need to change.  Marianne Sofia, RN,CCM [facility]  [phone] 11/11/2021, 1:47 PM

## 2021-11-11 NOTE — Op Note (Signed)
DATE OF SURGERY:  11/11/2021  TIME: 1:44 PM  PATIENT NAME:  [name]    AGE: 81 y.o.   PRE-OPERATIVE DIAGNOSIS:  left knee osteoarthritis  POST-OPERATIVE DIAGNOSIS:  left knee osteoarthritis  PROCEDURE:  Procedure(s): TOTAL KNEE ARTHROPLASTY  SURGEON:  [name]  ASSISTANT:  Melanie Haus, PA-C, present and scrubbed throughout the case, critical for assistance with exposure, retraction, instrumentation, and closure.  OPERATIVE IMPLANTS: Depuy PFC Attune Rotating Platform.  Femur size 6, Tibia size 7, Patella size 35 3-peg oval button, with a 1010 mm polyethylene insert.   PREOPERATIVE INDICATIONS:   [name] is a 81 y.o. year old female with end stage bone on bone arthritis of the knee who failed conservative treatment and elected for Total Knee Arthroplasty.   The risks, benefits, and alternatives were discussed at length including but not limited to the risks of infection, bleeding, nerve injury, stiffness, blood clots, the need for revision surgery, cardiopulmonary complications, among others, and they were willing to proceed.  OPERATIVE DESCRIPTION:  The patient was brought to the operative room and placed in a supine position.  Spinal anesthesia was administered.  IV antibiotics were given.  The lower extremity was prepped and draped in the usual sterile fashion.  Time out was performed.  The leg was elevated and exsanguinated and the tourniquet was inflated.  Anterior quadriceps tendon splitting approach was performed.  The patella was retracted and osteophytes were removed.  The anterior horn of the medial and lateral meniscus was removed and cruciate ligaments resected.   The distal femur was opened with the drill and the intramedullary distal femoral cutting jig was utilized, set at 5 degrees resecting 10 mm off the distal femur.  Care was taken to protect the collateral ligaments.  The distal femoral sizing jig was applied, taking care to  avoid notching.  Then the 4-in-1 cutting jig was applied and the anterior and posterior femur was cut, along with the chamfer cuts.    Then the extramedullary tibial cutting jig was utilized making the appropriate cut using the anterior tibial crest as a reference building in appropriate posterior slope.  Care was taken during the cut to protect the medial and collateral ligaments.  The proximal tibia was removed along with the posterior horns of the menisci.   The posterior medial femoral osteophytes and posterior lateral femoral osteophytes were removed.    The flexion gap was then measured and was symmetric with the extension gap, measured at 10.  I completed the distal femoral preparation using the appropriate jig to prepare the box.  The patella was then measured, and cut with the saw.    The proximal tibia sized and prepared accordingly with the reamer and the punch, and then all components were trialed with the trial insert.  The knee was found to have excellent balance and full motion.    The above named components were then cemented into place and all excess cement was removed.  The trial polyethylene component was in place during cementation, and then was exchanged for the real polyethylene component.    The knee was easily taken through a range of motion and the patella tracked well and the knee irrigated copiously and the parapatellar and subcutaneous tissue closed with vicryl, and monocryl with steri strips for the skin.  The arthrotomy was closed at 90 of flexion. The wounds were dressed with sterile gauze and the tourniquet released and the patient was awakened and returned to the PACU in  stable and satisfactory condition.  There were no complications.  Total tourniquet time was 110 minutes.

## 2021-11-11 NOTE — Anesthesia Procedure Notes (Signed)
Spinal  Patient location during procedure: OR Start time: 11/11/2021 11:42 AM End time: 11/11/2021 11:50 AM Reason for block: surgical anesthesia Staffing Performed: anesthesiologist  Anesthesiologist: [name], MD Preanesthetic Checklist Completed: patient identified, IV checked, risks and benefits discussed, surgical consent, monitors and equipment checked, pre-op evaluation and timeout performed Spinal Block Patient position: sitting Prep: DuraPrep Patient monitoring: heart rate, cardiac monitor, continuous pulse ox and blood pressure Approach: 1 attempt midline, 1 attempt left paramedian. Location: L3-4 Injection technique: single-shot Needle Needle type: Quincke  Needle gauge: 22 G Assessment Events: unable to locate intrathecal space Additional Notes Consent was obtained prior to the procedure with all questions answered and concerns addressed. Risks including, but not limited to, bleeding, infection, nerve damage, paralysis, failed block, inadequate analgesia, allergic reaction, high spinal, itching, and headache were discussed and the patient wished to proceed. Functioning IV was confirmed and monitors were applied. Sterile prep and drape, including hand hygiene, mask, and sterile gloves were used. The patient was positioned and the spine was prepped. The skin was anesthetized with lidocaine. After unsuccessful attempts, the procedure was aborted and decision was made to proceed with general anesthesia. No medications were given intrathecally. The needle was carefully withdrawn. The patient tolerated the procedure well.   Renold Don, MD

## 2021-11-11 NOTE — Plan of Care (Signed)
  Problem: Activity: Goal: Risk for activity intolerance will decrease Outcome: Progressing   Problem: Pain Managment: Goal: General experience of comfort will improve Outcome: Progressing   Problem: Safety: Goal: Ability to remain free from injury will improve Outcome: Progressing   

## 2021-11-11 NOTE — Interval H&P Note (Signed)
History and Physical Interval Note:  11/11/2021 8:34 AM  [name]  has presented today for surgery, with the diagnosis of left knee osteoarthritis.  The various methods of treatment have been discussed with the patient and family. After consideration of risks, benefits and other options for treatment, the patient has consented to  Procedure(s) with comments: TOTAL KNEE ARTHROPLASTY (Left) - with adductor canal patient has spinal cord stimulator 153min as a surgical intervention.  The patient's history has been reviewed, patient examined, no change in status, stable for surgery.  I have reviewed the patient's chart and labs.  Questions were answered to the patient's satisfaction.     [name] ANDREW

## 2021-11-11 NOTE — Anesthesia Procedure Notes (Signed)
Anesthesia Regional Block: Adductor canal block   Pre-Anesthetic Checklist: , timeout performed,  Correct Patient, Correct Site, Correct Laterality,  Correct Procedure, Correct Position, site marked,  Risks and benefits discussed,  Surgical consent,  Pre-op evaluation,  At surgeon's request and post-op pain management  Laterality: Left  Prep: chloraprep       Needles:  Injection technique: Single-shot  Needle Type: Echogenic Needle     Needle Length: 10cm  Needle Gauge: 21     Additional Needles:   Narrative:  Start time: 11/11/2021 10:10 AM End time: 11/11/2021 10:13 AM Injection made incrementally with aspirations every 5 mL.  Performed by: Personally  Anesthesiologist: [name], MD  Additional Notes: No pain on injection. No increased resistance to injection. Injection made in 5cc increments. Good needle visualization. Patient tolerated the procedure well.

## 2021-11-11 NOTE — Transfer of Care (Signed)
Immediate Anesthesia Transfer of Care Note  Patient: [name]  Procedure(s) Performed: TOTAL KNEE ARTHROPLASTY (Left: Knee)  Patient Location: PACU  Anesthesia Type:General  Level of Consciousness: awake, alert  and oriented  Airway & Oxygen Therapy: Patient Spontanous Breathing and Patient connected to face mask oxygen  Post-op Assessment: Report given to RN and Post -op Vital signs reviewed and stable  Post vital signs: Reviewed and stable  Last Vitals:  Vitals Value Taken Time  BP    Temp    Pulse    Resp    SpO2      Last Pain:  Vitals:   11/11/21 0914  TempSrc:   PainSc: 6       Patients Stated Pain Goal: 5 (09/81/19 1478)  Complications: No notable events documented.

## 2021-11-11 NOTE — Progress Notes (Signed)
Assisted Dr. Renold Don with left Knee Adductor Canal block. Side rails up, monitors on throughout procedure. See vital signs in flow sheet. Tolerated Procedure well.

## 2021-11-11 NOTE — Anesthesia Postprocedure Evaluation (Signed)
Anesthesia Post Note  Patient: [name]  Procedure(s) Performed: TOTAL KNEE ARTHROPLASTY (Left: Knee)     Patient location during evaluation: PACU Anesthesia Type: General Level of consciousness: awake and alert Pain management: pain level controlled Vital Signs Assessment: post-procedure vital signs reviewed and stable Respiratory status: spontaneous breathing, nonlabored ventilation, respiratory function stable and patient connected to nasal cannula oxygen Cardiovascular status: blood pressure returned to baseline and stable Postop Assessment: no apparent nausea or vomiting Anesthetic complications: no   No notable events documented.  Last Vitals:  Vitals:   11/11/21 1809 11/11/21 2012  BP: 133/72 (!) 142/73  Pulse: 92 (!) 102  Resp: 16 16  Temp: 36.6 C 36.6 C  SpO2: 96% 95%    Last Pain:  Vitals:   11/11/21 2012  TempSrc: Oral  PainSc:                  [name]

## 2021-11-11 NOTE — Anesthesia Procedure Notes (Signed)
Procedure Name: LMA Insertion Date/Time: 11/11/2021 11:52 AM Performed by: Milford Cage, CRNA Pre-anesthesia Checklist: Patient identified, Emergency Drugs available, Suction available and Patient being monitored Patient Re-evaluated:Patient Re-evaluated prior to induction Oxygen Delivery Method: Circle system utilized Preoxygenation: Pre-oxygenation with 100% oxygen Induction Type: IV induction Ventilation: Mask ventilation without difficulty LMA: LMA with gastric port inserted LMA Size: 4.0 Number of attempts: 1 Tube secured with: Tape Dental Injury: Teeth and Oropharynx as per pre-operative assessment

## 2021-11-12 ENCOUNTER — Other Ambulatory Visit: Payer: Self-pay

## 2021-11-12 DIAGNOSIS — M1712 Unilateral primary osteoarthritis, left knee: Secondary | ICD-10-CM | POA: Diagnosis not present

## 2021-11-12 MED ORDER — METHOCARBAMOL 500 MG PO TABS: ORAL_TABLET | ORAL | 0 refills | Status: AC

## 2021-11-12 MED ORDER — ONDANSETRON HCL 4 MG PO TABS: 4.0000 mg | ORAL_TABLET | Freq: Four times a day (QID) | ORAL | 0 refills | Status: DC | PRN

## 2021-11-12 MED ORDER — BISACODYL 5 MG PO TBEC: 5.0000 mg | DELAYED_RELEASE_TABLET | Freq: Every day | ORAL | 0 refills | Status: DC | PRN

## 2021-11-12 MED ORDER — OXYCODONE HCL 5 MG PO TABS: 5.0000 mg | ORAL_TABLET | ORAL | 0 refills | Status: DC | PRN

## 2021-11-12 MED ORDER — ASPIRIN EC 81 MG PO TBEC: 81.0000 mg | DELAYED_RELEASE_TABLET | Freq: Two times a day (BID) | ORAL | 0 refills | Status: AC

## 2021-11-12 MED ORDER — TRAMADOL HCL 50 MG PO TABS: 50.0000 mg | ORAL_TABLET | Freq: Four times a day (QID) | ORAL | 0 refills | Status: DC

## 2021-11-12 NOTE — Progress Notes (Signed)
Physical Therapy Treatment Patient Details Name: [name] MRN: [id number] DOB: [birth year] Today's Date: 11/12/2021   History of Present Illness Pt is an 81 year old female s/p Left TKA on 11/11/21.  PMHx: spinal cord stimulator s/p post laminectomy syndrome 06/07/21, HTN, HLD, DMII, colon cancer    PT Comments    Pt assisted into bathroom and then ambulated to stairs.  Pt educated on safe stair technique with rail and cane.  Pt performed twice and reports understanding.  Stair and HEP handouts provided for pt.  Pt eager and ready for d/c home today.    Recommendations for follow up therapy are one component of a multi-disciplinary discharge planning process, led by the attending physician.  Recommendations may be updated based on patient status, additional functional criteria and insurance authorization.  Follow Up Recommendations  Follow physician's recommendations for discharge plan and follow up therapies     Assistance Recommended at Discharge Intermittent Supervision/Assistance  Patient can return home with the following Help with stairs or ramp for entrance;A little help with walking and/or transfers;A little help with bathing/dressing/bathroom   Equipment Recommendations  None recommended by PT    Recommendations for Other Services       Precautions / Restrictions Precautions Precautions: Knee;Fall Restrictions Other Position/Activity Restrictions: WBAT     Mobility  Bed Mobility Overal bed mobility: Needs Assistance Bed Mobility: Supine to Sit     Supine to sit: Min guard, HOB elevated     General bed mobility comments: pt in recliner    Transfers Overall transfer level: Needs assistance Equipment used: Rolling walker (2 wheels) Transfers: Sit to/from Stand Sit to Stand: Min guard Stand pivot transfers: Min guard         General transfer comment: verbal cues for UE and LE positioning    Ambulation/Gait Ambulation/Gait assistance: Min  guard Gait Distance (Feet): 30 Feet Assistive device: Rolling walker (2 wheels) Gait Pattern/deviations: Step-to pattern, Decreased stance time - left, Antalgic       General Gait Details: verbal cues for sequence, RW positioning, step length, posture   Stairs Stairs: Yes Stairs assistance: Min guard Stair Management: Step to pattern, Forwards, One rail Right, With cane Number of Stairs: 2 General stair comments: verbal cues for sequence, cane positioning, safety; pt performed twice and reports understanding   Wheelchair Mobility    Modified Rankin (Stroke Patients Only)       Balance                                            Cognition Arousal/Alertness: Awake/alert Behavior During Therapy: WFL for tasks assessed/performed Overall Cognitive Status: Within Functional Limits for tasks assessed                                          Exercises     General Comments        Pertinent Vitals/Pain Pain Assessment Pain Assessment: 0-10 Pain Score: 5  Pain Location: left knee and calf Pain Descriptors / Indicators: Sore Pain Intervention(s): Monitored during session, Repositioned    Home Living Family/patient expects to be discharged to:: Private residence Living Arrangements: Spouse/significant other Available Help at Discharge: Family;Available 24 hours/day (son can stay and assist if needed) Type of Home: House Home Access: Stairs to enter  Entrance Stairs-Rails: Left Entrance Stairs-Number of Steps: 4   Home Layout: One level Home Equipment: Conservation officer, nature (2 wheels);Cane - single point      Prior Function            PT Goals (current goals can now be found in the care plan section) Acute Rehab PT Goals PT Goal Formulation: With patient Time For Goal Achievement: 11/18/21 Potential to Achieve Goals: Good Progress towards PT goals: Progressing toward goals    Frequency    7X/week      PT Plan Current plan  remains appropriate    Co-evaluation              AM-PAC PT "6 Clicks" Mobility   Outcome Measure  Help needed turning from your back to your side while in a flat bed without using bedrails?: A Little Help needed moving from lying on your back to sitting on the side of a flat bed without using bedrails?: A Little Help needed moving to and from a bed to a chair (including a wheelchair)?: A Little Help needed standing up from a chair using your arms (e.g., wheelchair or bedside chair)?: A Little Help needed to walk in hospital room?: A Little Help needed climbing 3-5 steps with a railing? : A Little 6 Click Score: 18    End of Session Equipment Utilized During Treatment: Gait belt Activity Tolerance: Patient tolerated treatment well Patient left: in chair;with call bell/phone within reach;with chair alarm set Nurse Communication: Mobility status PT Visit Diagnosis: Difficulty in walking, not elsewhere classified (R26.2)     Time: 0277-4128 PT Time Calculation (min) (ACUTE ONLY): 25 min  Charges:  $Gait Training: 23-37 mins           Jannette Spanner PT, DPT Acute Rehabilitation Services Pager: [phone] Office: [place] 11/12/2021, 2:16 PM

## 2021-11-12 NOTE — Progress Notes (Signed)
Subjective: 1 Day Post-Op Procedure(s) (LRB): TOTAL KNEE ARTHROPLASTY (Left) Patient reports pain as mild.   Pain well controlled on p.o. pain medication Doing well, no events overnight  Objective: Vital signs in last 24 hours: Temp:  [97.5 F (36.4 C)-98.5 F (36.9 C)] 98.5 F (36.9 C) (01/19 0557) Pulse Rate:  [69-102] 85 (01/19 0557) Resp:  [12-22] 17 (01/19 0557) BP: (120-159)/(66-86) 125/70 (01/19 0557) SpO2:  [91 %-99 %] 94 % (01/19 0557) Weight:  [95.3 kg] 95.3 kg (01/18 0854)  Intake/Output from previous day: 01/18 0701 - 01/19 0700 In: 2126.5 [I.V.:1926.5; IV Piggyback:200] Out: 20 [Blood:20] Intake/Output this shift: Total I/O In: 526.5 [I.V.:526.5] Out: -   Recent Labs    11/11/21 0905  HGB 15.9*   Recent Labs    11/11/21 0905  WBC 6.0  RBC 5.54*  HCT 49.8*  PLT 196   Recent Labs    11/11/21 0905  NA 140  K 3.7  CL 107  CO2 23  BUN 15  CREATININE 0.60  GLUCOSE 103*  CALCIUM 9.4   No results for input(s): LABPT, INR in the last 72 hours.  Neurologically intact Neurovascular intact Sensation intact distally Intact pulses distally Dorsiflexion/Plantar flexion intact Incision: dressing C/D/I No cellulitis present Compartment soft   Assessment/Plan: 1 Day Post-Op Procedure(s) (LRB): TOTAL KNEE ARTHROPLASTY (Left) Advance diet Up with therapy WBAT on LLE If patient does well with therapy she is okay to be discharged home today Patient already has rolling walker and handicap toilet at home. I will send in medication for her. DVT prophylaxis while in the hospital of Lovenox injections, she will start aspirin 81 mg tomorrow when she gets home Follow-up with [place] in the office in 2 weeks.    Patient's anticipated LOS is less than 2 midnights, meeting these requirements: - Younger than 45 - Lives within 1 hour of care - Has a competent adult at home to recover with post-op recover - NO history of  - Chronic pain requiring opiods  -  Diabetes  - Coronary Artery Disease  - Heart failure  - Heart attack  - Stroke  - DVT/VTE  - Cardiac arrhythmia  - Respiratory Failure/COPD  - Renal failure  - Anemia  - Advanced Liver disease     Derrick Ravel [phone] 11/12/2021, 6:38 AM

## 2021-11-12 NOTE — Progress Notes (Signed)
Discharge package printed and instructions given to pt. Pt Verbalizes understanding.

## 2021-11-12 NOTE — Discharge Summary (Signed)
Physician Discharge Summary  Patient ID: [name] MRN: [id number] DOB/AGE: [birth year] 81 y.o.  Admit date: 11/11/2021 Discharge date: 11/12/2021  Admission Diagnoses: Left knee osteoarthritis  Discharge Diagnoses:  Principal Problem:   Osteoarthritis of left knee   Discharged Condition: good  Hospital Course: Admitted on January 18 for a left total knee arthroplasty due to end-stage left knee osteoarthritis.  Patient did well during surgery.  She was sent to PACU in postop floor in stable condition.  She was not able to get up with therapy yesterday but she was able to get up a little bit with nursing staff.  Pain is well controlled with p.o. pain medication.  No events overnight.  Postop day 1 patient doing well.  Was able to get up with therapy and ambulate with use of rolling walker.  She was able to be discharged home with family today.  Pain medication, nausea medication, muscle relaxer were all sent to her pharmacy.  She will follow-up with [place] in the office in 2 weeks.  Consults: None  Significant Diagnostic Studies: none  Treatments: IV hydration, antibiotics: Ancef, analgesia: acetaminophen, Dilaudid, and oxycodone, anticoagulation: LMW heparin, therapies: PT, and surgery: Left TKA  Discharge Exam: Blood pressure 125/70, pulse 85, temperature 98.5 F (36.9 C), temperature source Oral, resp. rate 17, height 5\' 4"  (1.626 m), weight 95.3 kg, SpO2 94 %. General appearance: alert, cooperative, appears stated age, and no distress Extremities: extremities normal, atraumatic, no cyanosis or edema and Homans sign is negative, no sign of DVT, Limited ROM of left lower extremity at knee due to surgery. Pulses: 2+ and symmetric Skin: Skin color, texture, turgor normal. No rashes or lesions Incision/Wound: dressings C/D/I  Disposition: Discharge disposition: 01-Home or Self Care       Discharge Instructions     Call MD / Call 911   Complete by: As directed    If you  experience chest pain or shortness of breath, CALL 911 and be transported to the hospital emergency room.  If you develope a fever above 101 F, pus (white drainage) or increased drainage or redness at the wound, or calf pain, call your surgeon's office.   Constipation Prevention   Complete by: As directed    Drink plenty of fluids.  Prune juice may be helpful.  You may use a stool softener, such as Colace (over the counter) 100 mg twice a day.  Use MiraLax (over the counter) for constipation as needed.   Diet - low sodium heart healthy   Complete by: As directed    Discharge instructions   Complete by: As directed    Dr. Sydnee Cabal [facility] [address] [place], [place] [address] [phone]  TOTAL KNEE REPLACEMENT POSTOPERATIVE DIRECTIONS  Knee Rehabilitation, Guidelines Following Surgery  Results after knee surgery are often greatly improved when you follow the exercise, range of motion and muscle strengthening exercises prescribed by your doctor. Safety measures are also important to protect the knee from further injury. Any time any of these exercises cause you to have increased pain or swelling in your knee joint, decrease the amount until you are comfortable again and slowly increase them. If you have problems or questions, call your caregiver or physical therapist for advice.   HOME CARE INSTRUCTIONS  Remove items at home which could result in a fall. This includes throw rugs or furniture in walking pathways.  ICE to the affected knee every three hours for 30 minutes at a time and then  as needed for pain and swelling.  Continue to use ice on the knee for pain and swelling from surgery. You may notice swelling that will progress down to the foot and ankle.  This is normal after surgery.  Elevate the leg when you are not up walking on it.   Continue to use the breathing machine which will help keep your temperature down.  It is common for your temperature to cycle up  and down following surgery, especially at night when you are not up moving around and exerting yourself.  The breathing machine keeps your lungs expanded and your temperature down. Do not place pillow under knee, focus on keeping the knee straight while resting   DIET You may resume your previous home diet once your are discharged from the hospital.  DRESSING / WOUND CARE / SHOWERING Keep the surgical dressing until follow up.  The dressing is water proof, but you need to put extra covering over it like plastic wrap.  IF THE DRESSING FALLS OFF or the wound gets wet inside, change the dressing with sterile gauze.  Please use good hand washing techniques before changing the dressing.  Do not use any lotions or creams on the incision until instructed by your surgeon.   You may start showering once you are discharged home but do not submerge the incision under water.  You are sent home with an ACE bandage on over the leg, this can be removed at 3 days from surgery. At this time you can start showering. Please place the white TED stocking on the surgical leg after. This needs to be worn on the surgical leg for 2 weeks after surgery.   ACTIVITY Walk with your walker as instructed. Use walker as long as suggested by your caregivers. Avoid periods of inactivity such as sitting longer than an hour when not asleep. This helps prevent blood clots.  You may resume a sexual relationship in one month or when given the OK by your doctor.  You may return to work once you are cleared by your doctor.  Do not drive a car for 6 weeks or until released by you surgeon.  Do not drive while taking narcotics.  WEIGHT BEARING Weight bearing as tolerated with assist device (walker, cane, etc) as directed, use it as long as suggested by your surgeon or therapist, typically at least 4-6 weeks.  POSTOPERATIVE CONSTIPATION PROTOCOL Constipation - defined medically as fewer than three stools per week and severe constipation  as less than one stool per week.  One of the most common issues patients have following surgery is constipation.  Even if you have a regular bowel pattern at home, your normal regimen is likely to be disrupted due to multiple reasons following surgery.  Combination of anesthesia, postoperative narcotics, change in appetite and fluid intake all can affect your bowels.  In order to avoid complications following surgery, here are some recommendations in order to help you during your recovery period.  Colace (docusate) - Pick up an over-the-counter form of Colace or another stool softener and take twice a day as long as you are requiring postoperative pain medications.  Take with a full glass of water daily.  If you experience loose stools or diarrhea, hold the colace until you stool forms back up.  If your symptoms do not get better within 1 week or if they get worse, check with your doctor.  Dulcolax (bisacodyl) - Pick up over-the-counter and take as directed by the product packaging  as needed to assist with the movement of your bowels.  Take with a full glass of water.  Use this product as needed if not relieved by Colace only.   MiraLax (polyethylene glycol) - Pick up over-the-counter to have on hand.  MiraLax is a solution that will increase the amount of water in your bowels to assist with bowel movements.  Take as directed and can mix with a glass of water, juice, soda, coffee, or tea.  Take if you go more than two days without a movement. Do not use MiraLax more than once per day. Call your doctor if you are still constipated or irregular after using this medication for 7 days in a row.  If you continue to have problems with postoperative constipation, please contact the office for further assistance and recommendations.  If you experience "the worst abdominal pain ever" or develop nausea or vomiting, please contact the office immediatly for further recommendations for treatment.  ITCHING  If you  experience itching with your medications, try taking only a single pain pill, or even half a pain pill at a time.  You can also use Benadryl over the counter for itching or also to help with sleep.   TED HOSE STOCKINGS Wear the elastic stockings on both legs for two weeks following surgery during the day but you may remove then at night for sleeping.  Okay to remove ACE in 3 days, put TED on after  MEDICATIONS See your medication summary on the "After Visit Summary" that the nursing staff will review with you prior to discharge.  You may have some home medications which will be placed on hold until you complete the course of blood thinner medication.  It is important for you to complete the blood thinner medication as prescribed by your surgeon.  Continue your approved medications as instructed at time of discharge. If you were not previously taking any blood thinners prior to surgery please start taking Aspirin 325 mg tabs twice daily for 6 weeks. If you are unable to take Aspirin please let your doctor know.   PRECAUTIONS If you experience chest pain or shortness of breath - call 911 immediately for transfer to the hospital emergency department.  If you develop a fever greater that 101 F, purulent drainage from wound, increased redness or drainage from wound, foul odor from the wound/dressing, or calf pain - CONTACT YOUR SURGEON.                                                   FOLLOW-UP APPOINTMENTS Make sure you keep all of your appointments after your operation with your surgeon and caregivers. You should call the office at the above phone number and make an appointment for approximately two weeks after the date of your surgery or on the date instructed by your surgeon outlined in the "After Visit Summary".   RANGE OF MOTION AND STRENGTHENING EXERCISES  Rehabilitation of the knee is important following a knee injury or an operation. After just a few days of immobilization, the muscles of  the thigh which control the knee become weakened and shrink (atrophy). Knee exercises are designed to build up the tone and strength of the thigh muscles and to improve knee motion. Often times heat used for twenty to thirty minutes before working out will loosen up your tissues and  help with improving the range of motion but do not use heat for the first two weeks following surgery. These exercises can be done on a training (exercise) mat, on the floor, on a table or on a bed. Use what ever works the best and is most comfortable for you Knee exercises include:  Leg Lifts - While your knee is still immobilized in a splint or cast, you can do straight leg raises. Lift the leg to 60 degrees, hold for 3 sec, and slowly lower the leg. Repeat 10-20 times 2-3 times daily. Perform this exercise against resistance later as your knee gets better.  Quad and Hamstring Sets - Tighten up the muscle on the front of the thigh (Quad) and hold for 5-10 sec. Repeat this 10-20 times hourly. Hamstring sets are done by pushing the foot backward against an object and holding for 5-10 sec. Repeat as with quad sets.  Leg Slides: Lying on your back, slowly slide your foot toward your buttocks, bending your knee up off the floor (only go as far as is comfortable). Then slowly slide your foot back down until your leg is flat on the floor again. Angel Wings: Lying on your back spread your legs to the side as far apart as you can without causing discomfort.  A rehabilitation program following serious knee injuries can speed recovery and prevent re-injury in the future due to weakened muscles. Contact your doctor or a physical therapist for more information on knee rehabilitation.   IF YOU ARE TRANSFERRED TO A SKILLED REHAB FACILITY If the patient is transferred to a skilled rehab facility following release from the hospital, a list of the current medications will be sent to the facility for the patient to continue.  When discharged from  the skilled rehab facility, please have the facility set up the patient's [place] prior to being released. Also, the skilled facility will be responsible for providing the patient with their medications at time of release from the facility to include their pain medication, the muscle relaxants, and their blood thinner medication. If the patient is still at the rehab facility at time of the two week follow up appointment, the skilled rehab facility will also need to assist the patient in arranging follow up appointment in our office and any transportation needs.  MAKE SURE YOU:  Understand these instructions.  Get help right away if you are not doing well or get worse.    Pick up stool softner and laxative for home use following surgery while on pain medications. May shower starting three days after surgery. Please use a clean towel to pat the leg dry following showers. Continue to use ice for pain and swelling after surgery. Do not use any lotions or creams on the incision until instructed by you Start Aspirin immediately following surgery.   Do not put a pillow under the knee. Place it under the heel.   Complete by: As directed    Driving restrictions   Complete by: As directed    No driving for two weeks   Post-operative opioid taper instructions:   Complete by: As directed    POST-OPERATIVE OPIOID TAPER INSTRUCTIONS: It is important to wean off of your opioid medication as soon as possible. If you do not need pain medication after your surgery it is ok to stop day one. Opioids include: Codeine, Hydrocodone(Norco, Vicodin), Oxycodone(Percocet, oxycontin) and hydromorphone amongst others.  Long term and even short term use of opiods can cause: Increased  pain response Dependence Constipation Depression Respiratory depression And more.  Withdrawal symptoms can include Flu like symptoms Nausea, vomiting And more Techniques to manage these symptoms Hydrate  well Eat regular healthy meals Stay active Use relaxation techniques(deep breathing, meditating, yoga) Do Not substitute Alcohol to help with tapering If you have been on opioids for less than two weeks and do not have pain than it is ok to stop all together.  Plan to wean off of opioids This plan should start within one week post op of your joint replacement. Maintain the same interval or time between taking each dose and first decrease the dose.  Cut the total daily intake of opioids by one tablet each day Next start to increase the time between doses. The last dose that should be eliminated is the evening dose.      TED hose   Complete by: As directed    Use stockings (TED hose) for three weeks on both leg(s).  You may remove them at night for sleeping.   Weight bearing as tolerated   Complete by: As directed       Allergies as of 11/12/2021       Reactions   Cortisone Acetate [cortisone] Other (See Comments)   Hyperglycemia(adverse Reaction)   Metformin And Related Diarrhea   Misoprostol Diarrhea   Fosamax [alendronate Sodium] Nausea And Vomiting   Gabapentin Nausea And Vomiting   Tape    Pulls skin off        Medication List     TAKE these medications    bisacodyl 5 MG EC tablet Commonly known as: DULCOLAX Take 1 tablet (5 mg total) by mouth daily as needed for moderate constipation.   CALCIUM-VITAMIN D PO Take 1 tablet by mouth in the morning and at bedtime.   diltiazem 240 MG 24 hr capsule Commonly known as: DILACOR XR Take 240 mg by mouth daily.   Dulaglutide 3 MG/0.5ML Sopn Inject 3 mg into the skin every Saturday.   empagliflozin 25 MG Tabs tablet Commonly known as: JARDIANCE Take 25 mg by mouth daily.   fluticasone 50 MCG/ACT nasal spray Commonly known as: FLONASE Place 2 sprays into both nostrils daily as needed for allergies or rhinitis.   glimepiride 4 MG tablet Commonly known as: AMARYL Take 4 mg by mouth 2 (two) times daily.    levothyroxine 112 MCG tablet Commonly known as: SYNTHROID Take 112 mcg by mouth daily before breakfast.   meloxicam 7.5 MG tablet Commonly known as: MOBIC Take 7.5 mg by mouth in the morning and at bedtime.   methocarbamol 500 MG tablet Commonly known as: ROBAXIN Take 1-2 tabs PO q 6 hrs prn muscle spasms   multivitamin with minerals tablet Take 1 tablet by mouth daily.   omeprazole 40 MG capsule Commonly known as: PRILOSEC Take 40 mg by mouth at bedtime.   ondansetron 4 MG tablet Commonly known as: Zofran Take 1 tablet (4 mg total) by mouth every 8 (eight) hours as needed for nausea or vomiting. What changed: Another medication with the same name was added. Make sure you understand how and when to take each.   ondansetron 4 MG tablet Commonly known as: ZOFRAN Take 1 tablet (4 mg total) by mouth every 6 (six) hours as needed for nausea. What changed: You were already taking a medication with the same name, and this prescription was added. Make sure you understand how and when to take each.   oxyCODONE 5 MG immediate release tablet Commonly known  as: Oxy IR/ROXICODONE Take 1-2 tablets (5-10 mg total) by mouth every 4 (four) hours as needed for moderate pain or severe pain (pain score 4-6).   pregabalin 150 MG capsule Commonly known as: LYRICA Take 150 mg by mouth 2 (two) times daily.   rosuvastatin 20 MG tablet Commonly known as: CRESTOR Take 20 mg by mouth daily.   traMADol 50 MG tablet Commonly known as: ULTRAM Take 1 tablet (50 mg total) by mouth every 6 (six) hours.               Discharge Care Instructions  (From admission, onward)           Start     Ordered   11/12/21 0000  Weight bearing as tolerated        11/12/21 0644             Signed: Cordelia Pen Trigo Winterbottom 11/12/2021, 6:44 AM

## 2021-11-12 NOTE — Progress Notes (Signed)
Met with pt (ortho bundle) and confirming no DME needs. Plan OPPT in [place]. No TOC needs.  Sofija Antwi, LCSW

## 2021-11-12 NOTE — Plan of Care (Signed)
°  Problem: Activity: Goal: Risk for activity intolerance will decrease 11/12/2021 0716 by Mayme Genta, RN Outcome: Progressing 11/11/2021 1833 by Mayme Genta, RN Outcome: Progressing   Problem: Safety: Goal: Ability to remain free from injury will improve 11/12/2021 0716 by Mayme Genta, RN Outcome: Progressing 11/11/2021 1833 by Mayme Genta, RN Outcome: Progressing   Problem: Pain Managment: Goal: General experience of comfort will improve 11/12/2021 0716 by Mayme Genta, RN Outcome: Progressing 11/11/2021 1833 by Mayme Genta, RN Outcome: Progressing

## 2021-11-12 NOTE — Plan of Care (Signed)
°  Problem: Education: Goal: Knowledge of General Education information will improve Description: Including pain rating scale, medication(s)/side effects and non-pharmacologic comfort measures Outcome: Progressing   Problem: Health Behavior/Discharge Planning: Goal: Ability to manage health-related needs will improve Outcome: Progressing   Problem: Activity: Goal: Risk for activity intolerance will decrease Outcome: Progressing   Problem: Nutrition: Goal: Adequate nutrition will be maintained Outcome: Progressing   Problem: Elimination: Goal: Will not experience complications related to urinary retention Outcome: Progressing   Problem: Pain Managment: Goal: General experience of comfort will improve Outcome: Progressing   

## 2021-11-12 NOTE — Evaluation (Signed)
Physical Therapy Evaluation Patient Details Name: [name] MRN: [id number] DOB: [birth year] Today's Date: 11/12/2021  History of Present Illness  Pt is an 81 year old female s/p Left TKA on 11/11/21.  PMHx: spinal cord stimulator s/p post laminectomy syndrome 06/07/21, HTN, HLD, DMII, colon cancer  Clinical Impression  Pt is s/p TKA resulting in the deficits listed below (see PT Problem List). Pt will benefit from skilled PT to increase their independence and safety with mobility to allow discharge to the venue listed below.  Pt ambulated in hallway and performed LE exercises.  Pt anticipates d/c home later today.  Will need to practice steps prior to d/c.  Pt reports her son will bring her home and is able to stay with her if needed upon d/c.       Recommendations for follow up therapy are one component of a multi-disciplinary discharge planning process, led by the attending physician.  Recommendations may be updated based on patient status, additional functional criteria and insurance authorization.  Follow Up Recommendations Follow physician's recommendations for discharge plan and follow up therapies    Assistance Recommended at Discharge Intermittent Supervision/Assistance  Patient can return home with the following  Help with stairs or ramp for entrance;A little help with walking and/or transfers;A little help with bathing/dressing/bathroom    Equipment Recommendations None recommended by PT  Recommendations for Other Services       Functional Status Assessment Patient has had a recent decline in their functional status and demonstrates the ability to make significant improvements in function in a reasonable and predictable amount of time.     Precautions / Restrictions Precautions Precautions: Knee;Fall Restrictions Weight Bearing Restrictions: No Other Position/Activity Restrictions: WBAT      Mobility  Bed Mobility Overal bed mobility: Needs Assistance Bed  Mobility: Supine to Sit     Supine to sit: Min guard, HOB elevated          Transfers Overall transfer level: Needs assistance Equipment used: Rolling walker (2 wheels) Transfers: Sit to/from Stand, Bed to chair/wheelchair/BSC Sit to Stand: Min assist, Min guard Stand pivot transfers: Min guard         General transfer comment: verbal cues for UE and LE positioning, assist initially to rise however min/guard thereafter, pt assisted to [facility] from recliner end of session per request    Ambulation/Gait Ambulation/Gait assistance: Min guard Gait Distance (Feet): 140 Feet Assistive device: Rolling walker (2 wheels) Gait Pattern/deviations: Step-to pattern, Decreased stance time - left, Antalgic       General Gait Details: verbal cues for sequence, RW positioning, step length, posture  Stairs            Wheelchair Mobility    Modified Rankin (Stroke Patients Only)       Balance                                             Pertinent Vitals/Pain Pain Assessment Pain Assessment: 0-10 Pain Score: 6  Pain Location: left knee and calf Pain Descriptors / Indicators: Sore Pain Intervention(s): Repositioned, Monitored during session, Ice applied    Home Living Family/patient expects to be discharged to:: Private residence Living Arrangements: Spouse/significant other Available Help at Discharge: Family;Available 24 hours/day (son can stay and assist if needed) Type of Home: House Home Access: Stairs to enter Entrance Stairs-Rails: Left Entrance Stairs-Number of Steps: 4  Home Layout: One level Home Equipment: Conservation officer, nature (2 wheels);Cane - single point      Prior Function Prior Level of Function : Independent/Modified Independent                     Hand Dominance        Extremity/Trunk Assessment        Lower Extremity Assessment Lower Extremity Assessment: LLE deficits/detail LLE Deficits / Details: approx 5-85* AAROM  left knee flexion       Communication   Communication: No difficulties  Cognition Arousal/Alertness: Awake/alert Behavior During Therapy: WFL for tasks assessed/performed Overall Cognitive Status: Within Functional Limits for tasks assessed                                          General Comments      Exercises Total Joint Exercises Ankle Circles/Pumps: AROM, Both, 10 reps Quad Sets: AROM, Left, 10 reps Short Arc Quad: AROM, Left, 10 reps Heel Slides: AAROM, Left, 10 reps Hip ABduction/ADduction: AAROM, Left, 10 reps Straight Leg Raises: AAROM, Left, 10 reps Knee Flexion: AROM, Left, Seated, 5 reps   Assessment/Plan    PT Assessment Patient needs continued PT services  PT Problem List Decreased strength;Decreased activity tolerance;Decreased range of motion;Decreased mobility;Decreased knowledge of precautions;Pain;Decreased knowledge of use of DME       PT Treatment Interventions Stair training;Gait training;Balance training;Therapeutic exercise;DME instruction;Functional mobility training;Therapeutic activities;Patient/family education    PT Goals (Current goals can be found in the Care Plan section)  Acute Rehab PT Goals PT Goal Formulation: With patient Time For Goal Achievement: 11/18/21 Potential to Achieve Goals: Good    Frequency 7X/week     Co-evaluation               AM-PAC PT "6 Clicks" Mobility  Outcome Measure Help needed turning from your back to your side while in a flat bed without using bedrails?: A Little Help needed moving from lying on your back to sitting on the side of a flat bed without using bedrails?: A Little Help needed moving to and from a bed to a chair (including a wheelchair)?: A Little Help needed standing up from a chair using your arms (e.g., wheelchair or bedside chair)?: A Little Help needed to walk in hospital room?: A Little Help needed climbing 3-5 steps with a railing? : A Little 6 Click Score:  18    End of Session Equipment Utilized During Treatment: Gait belt Activity Tolerance: Patient tolerated treatment well Patient left: in chair;with call bell/phone within reach;with chair alarm set Nurse Communication: Mobility status PT Visit Diagnosis: Difficulty in walking, not elsewhere classified (R26.2)    Time: 6387-5643 PT Time Calculation (min) (ACUTE ONLY): 32 min   Charges:   PT Evaluation $PT Eval Low Complexity: 1 Low PT Treatments $Therapeutic Exercise: 8-22 mins      Jannette Spanner PT, DPT Acute Rehabilitation Services Pager: [phone] Office: [phone]   Myrtis Hopping [place] 11/12/2021, 12:56 PM

## 2021-11-13 ENCOUNTER — Encounter (HOSPITAL_COMMUNITY): Payer: Self-pay | Admitting: Specialist

## 2021-11-16 DIAGNOSIS — Z96652 Presence of left artificial knee joint: Secondary | ICD-10-CM | POA: Diagnosis not present

## 2021-11-16 DIAGNOSIS — M6281 Muscle weakness (generalized): Secondary | ICD-10-CM | POA: Diagnosis not present

## 2021-11-16 DIAGNOSIS — M25662 Stiffness of left knee, not elsewhere classified: Secondary | ICD-10-CM | POA: Diagnosis not present

## 2021-11-16 DIAGNOSIS — M25562 Pain in left knee: Secondary | ICD-10-CM | POA: Diagnosis not present

## 2021-11-18 DIAGNOSIS — Z96652 Presence of left artificial knee joint: Secondary | ICD-10-CM | POA: Diagnosis not present

## 2021-11-18 DIAGNOSIS — M25562 Pain in left knee: Secondary | ICD-10-CM | POA: Diagnosis not present

## 2021-11-18 DIAGNOSIS — M6281 Muscle weakness (generalized): Secondary | ICD-10-CM | POA: Diagnosis not present

## 2021-11-18 DIAGNOSIS — M25662 Stiffness of left knee, not elsewhere classified: Secondary | ICD-10-CM | POA: Diagnosis not present

## 2021-11-23 DIAGNOSIS — M25662 Stiffness of left knee, not elsewhere classified: Secondary | ICD-10-CM | POA: Diagnosis not present

## 2021-11-23 DIAGNOSIS — M6281 Muscle weakness (generalized): Secondary | ICD-10-CM | POA: Diagnosis not present

## 2021-11-23 DIAGNOSIS — M25562 Pain in left knee: Secondary | ICD-10-CM | POA: Diagnosis not present

## 2021-11-23 DIAGNOSIS — Z96652 Presence of left artificial knee joint: Secondary | ICD-10-CM | POA: Diagnosis not present

## 2021-11-25 DIAGNOSIS — Z96652 Presence of left artificial knee joint: Secondary | ICD-10-CM | POA: Diagnosis not present

## 2021-11-25 DIAGNOSIS — M6281 Muscle weakness (generalized): Secondary | ICD-10-CM | POA: Diagnosis not present

## 2021-11-25 DIAGNOSIS — M25662 Stiffness of left knee, not elsewhere classified: Secondary | ICD-10-CM | POA: Diagnosis not present

## 2021-11-25 DIAGNOSIS — M25562 Pain in left knee: Secondary | ICD-10-CM | POA: Diagnosis not present

## 2021-11-25 DIAGNOSIS — Z4789 Encounter for other orthopedic aftercare: Secondary | ICD-10-CM | POA: Diagnosis not present

## 2021-11-30 DIAGNOSIS — Z96652 Presence of left artificial knee joint: Secondary | ICD-10-CM | POA: Diagnosis not present

## 2021-11-30 DIAGNOSIS — M25662 Stiffness of left knee, not elsewhere classified: Secondary | ICD-10-CM | POA: Diagnosis not present

## 2021-11-30 DIAGNOSIS — M25562 Pain in left knee: Secondary | ICD-10-CM | POA: Diagnosis not present

## 2021-11-30 DIAGNOSIS — M6281 Muscle weakness (generalized): Secondary | ICD-10-CM | POA: Diagnosis not present

## 2021-12-02 DIAGNOSIS — M25662 Stiffness of left knee, not elsewhere classified: Secondary | ICD-10-CM | POA: Diagnosis not present

## 2021-12-02 DIAGNOSIS — M6281 Muscle weakness (generalized): Secondary | ICD-10-CM | POA: Diagnosis not present

## 2021-12-02 DIAGNOSIS — Z96652 Presence of left artificial knee joint: Secondary | ICD-10-CM | POA: Diagnosis not present

## 2021-12-02 DIAGNOSIS — M25562 Pain in left knee: Secondary | ICD-10-CM | POA: Diagnosis not present

## 2021-12-07 DIAGNOSIS — M6281 Muscle weakness (generalized): Secondary | ICD-10-CM | POA: Diagnosis not present

## 2021-12-07 DIAGNOSIS — M25562 Pain in left knee: Secondary | ICD-10-CM | POA: Diagnosis not present

## 2021-12-07 DIAGNOSIS — Z96652 Presence of left artificial knee joint: Secondary | ICD-10-CM | POA: Diagnosis not present

## 2021-12-07 DIAGNOSIS — M25662 Stiffness of left knee, not elsewhere classified: Secondary | ICD-10-CM | POA: Diagnosis not present

## 2021-12-14 DIAGNOSIS — M25562 Pain in left knee: Secondary | ICD-10-CM | POA: Diagnosis not present

## 2021-12-14 DIAGNOSIS — M6281 Muscle weakness (generalized): Secondary | ICD-10-CM | POA: Diagnosis not present

## 2021-12-14 DIAGNOSIS — Z96652 Presence of left artificial knee joint: Secondary | ICD-10-CM | POA: Diagnosis not present

## 2021-12-14 DIAGNOSIS — M25662 Stiffness of left knee, not elsewhere classified: Secondary | ICD-10-CM | POA: Diagnosis not present

## 2021-12-16 DIAGNOSIS — M25562 Pain in left knee: Secondary | ICD-10-CM | POA: Diagnosis not present

## 2021-12-16 DIAGNOSIS — Z96652 Presence of left artificial knee joint: Secondary | ICD-10-CM | POA: Diagnosis not present

## 2021-12-16 DIAGNOSIS — M25662 Stiffness of left knee, not elsewhere classified: Secondary | ICD-10-CM | POA: Diagnosis not present

## 2021-12-16 DIAGNOSIS — M6281 Muscle weakness (generalized): Secondary | ICD-10-CM | POA: Diagnosis not present

## 2021-12-21 DIAGNOSIS — M6281 Muscle weakness (generalized): Secondary | ICD-10-CM | POA: Diagnosis not present

## 2021-12-21 DIAGNOSIS — M25562 Pain in left knee: Secondary | ICD-10-CM | POA: Diagnosis not present

## 2021-12-21 DIAGNOSIS — M25662 Stiffness of left knee, not elsewhere classified: Secondary | ICD-10-CM | POA: Diagnosis not present

## 2021-12-21 DIAGNOSIS — Z96652 Presence of left artificial knee joint: Secondary | ICD-10-CM | POA: Diagnosis not present

## 2021-12-28 DIAGNOSIS — Z96652 Presence of left artificial knee joint: Secondary | ICD-10-CM | POA: Diagnosis not present

## 2021-12-28 DIAGNOSIS — M25562 Pain in left knee: Secondary | ICD-10-CM | POA: Diagnosis not present

## 2021-12-28 DIAGNOSIS — M25662 Stiffness of left knee, not elsewhere classified: Secondary | ICD-10-CM | POA: Diagnosis not present

## 2021-12-28 DIAGNOSIS — Z4789 Encounter for other orthopedic aftercare: Secondary | ICD-10-CM | POA: Diagnosis not present

## 2021-12-28 DIAGNOSIS — M6281 Muscle weakness (generalized): Secondary | ICD-10-CM | POA: Diagnosis not present

## 2021-12-30 DIAGNOSIS — Z96652 Presence of left artificial knee joint: Secondary | ICD-10-CM | POA: Diagnosis not present

## 2021-12-30 DIAGNOSIS — M25662 Stiffness of left knee, not elsewhere classified: Secondary | ICD-10-CM | POA: Diagnosis not present

## 2021-12-30 DIAGNOSIS — M6281 Muscle weakness (generalized): Secondary | ICD-10-CM | POA: Diagnosis not present

## 2021-12-30 DIAGNOSIS — M25562 Pain in left knee: Secondary | ICD-10-CM | POA: Diagnosis not present

## 2022-01-04 DIAGNOSIS — M25662 Stiffness of left knee, not elsewhere classified: Secondary | ICD-10-CM | POA: Diagnosis not present

## 2022-01-04 DIAGNOSIS — M6281 Muscle weakness (generalized): Secondary | ICD-10-CM | POA: Diagnosis not present

## 2022-01-04 DIAGNOSIS — M25562 Pain in left knee: Secondary | ICD-10-CM | POA: Diagnosis not present

## 2022-01-04 DIAGNOSIS — Z96652 Presence of left artificial knee joint: Secondary | ICD-10-CM | POA: Diagnosis not present

## 2022-01-11 DIAGNOSIS — M6281 Muscle weakness (generalized): Secondary | ICD-10-CM | POA: Diagnosis not present

## 2022-01-11 DIAGNOSIS — M25662 Stiffness of left knee, not elsewhere classified: Secondary | ICD-10-CM | POA: Diagnosis not present

## 2022-01-11 DIAGNOSIS — Z96652 Presence of left artificial knee joint: Secondary | ICD-10-CM | POA: Diagnosis not present

## 2022-01-11 DIAGNOSIS — M25562 Pain in left knee: Secondary | ICD-10-CM | POA: Diagnosis not present

## 2022-01-13 DIAGNOSIS — M6281 Muscle weakness (generalized): Secondary | ICD-10-CM | POA: Diagnosis not present

## 2022-01-13 DIAGNOSIS — Z96652 Presence of left artificial knee joint: Secondary | ICD-10-CM | POA: Diagnosis not present

## 2022-01-13 DIAGNOSIS — M25662 Stiffness of left knee, not elsewhere classified: Secondary | ICD-10-CM | POA: Diagnosis not present

## 2022-01-13 DIAGNOSIS — M25562 Pain in left knee: Secondary | ICD-10-CM | POA: Diagnosis not present

## 2022-01-15 DIAGNOSIS — M1711 Unilateral primary osteoarthritis, right knee: Secondary | ICD-10-CM | POA: Diagnosis not present

## 2022-01-18 DIAGNOSIS — M25662 Stiffness of left knee, not elsewhere classified: Secondary | ICD-10-CM | POA: Diagnosis not present

## 2022-01-18 DIAGNOSIS — M25562 Pain in left knee: Secondary | ICD-10-CM | POA: Diagnosis not present

## 2022-01-18 DIAGNOSIS — M6281 Muscle weakness (generalized): Secondary | ICD-10-CM | POA: Diagnosis not present

## 2022-01-18 DIAGNOSIS — Z96652 Presence of left artificial knee joint: Secondary | ICD-10-CM | POA: Diagnosis not present

## 2022-01-20 DIAGNOSIS — M25662 Stiffness of left knee, not elsewhere classified: Secondary | ICD-10-CM | POA: Diagnosis not present

## 2022-01-20 DIAGNOSIS — M25562 Pain in left knee: Secondary | ICD-10-CM | POA: Diagnosis not present

## 2022-01-20 DIAGNOSIS — Z96652 Presence of left artificial knee joint: Secondary | ICD-10-CM | POA: Diagnosis not present

## 2022-01-20 DIAGNOSIS — M6281 Muscle weakness (generalized): Secondary | ICD-10-CM | POA: Diagnosis not present

## 2022-01-25 DIAGNOSIS — M25662 Stiffness of left knee, not elsewhere classified: Secondary | ICD-10-CM | POA: Diagnosis not present

## 2022-01-25 DIAGNOSIS — Z96652 Presence of left artificial knee joint: Secondary | ICD-10-CM | POA: Diagnosis not present

## 2022-01-25 DIAGNOSIS — M25562 Pain in left knee: Secondary | ICD-10-CM | POA: Diagnosis not present

## 2022-01-25 DIAGNOSIS — M6281 Muscle weakness (generalized): Secondary | ICD-10-CM | POA: Diagnosis not present

## 2022-01-27 DIAGNOSIS — M25562 Pain in left knee: Secondary | ICD-10-CM | POA: Diagnosis not present

## 2022-01-27 DIAGNOSIS — M6281 Muscle weakness (generalized): Secondary | ICD-10-CM | POA: Diagnosis not present

## 2022-01-27 DIAGNOSIS — Z96652 Presence of left artificial knee joint: Secondary | ICD-10-CM | POA: Diagnosis not present

## 2022-01-27 DIAGNOSIS — M25662 Stiffness of left knee, not elsewhere classified: Secondary | ICD-10-CM | POA: Diagnosis not present

## 2022-02-01 DIAGNOSIS — Z96652 Presence of left artificial knee joint: Secondary | ICD-10-CM | POA: Diagnosis not present

## 2022-02-01 DIAGNOSIS — M25562 Pain in left knee: Secondary | ICD-10-CM | POA: Diagnosis not present

## 2022-02-01 DIAGNOSIS — M6281 Muscle weakness (generalized): Secondary | ICD-10-CM | POA: Diagnosis not present

## 2022-02-01 DIAGNOSIS — M25662 Stiffness of left knee, not elsewhere classified: Secondary | ICD-10-CM | POA: Diagnosis not present

## 2022-02-03 DIAGNOSIS — Z96652 Presence of left artificial knee joint: Secondary | ICD-10-CM | POA: Diagnosis not present

## 2022-02-03 DIAGNOSIS — M6281 Muscle weakness (generalized): Secondary | ICD-10-CM | POA: Diagnosis not present

## 2022-02-03 DIAGNOSIS — M25662 Stiffness of left knee, not elsewhere classified: Secondary | ICD-10-CM | POA: Diagnosis not present

## 2022-02-03 DIAGNOSIS — M25562 Pain in left knee: Secondary | ICD-10-CM | POA: Diagnosis not present

## 2022-02-04 DIAGNOSIS — I10 Essential (primary) hypertension: Secondary | ICD-10-CM | POA: Diagnosis not present

## 2022-02-04 DIAGNOSIS — M810 Age-related osteoporosis without current pathological fracture: Secondary | ICD-10-CM | POA: Diagnosis not present

## 2022-02-04 DIAGNOSIS — E1169 Type 2 diabetes mellitus with other specified complication: Secondary | ICD-10-CM | POA: Diagnosis not present

## 2022-02-04 DIAGNOSIS — M150 Primary generalized (osteo)arthritis: Secondary | ICD-10-CM | POA: Diagnosis not present

## 2022-02-04 DIAGNOSIS — E669 Obesity, unspecified: Secondary | ICD-10-CM | POA: Diagnosis not present

## 2022-02-04 DIAGNOSIS — E7800 Pure hypercholesterolemia, unspecified: Secondary | ICD-10-CM | POA: Diagnosis not present

## 2022-02-04 DIAGNOSIS — K219 Gastro-esophageal reflux disease without esophagitis: Secondary | ICD-10-CM | POA: Diagnosis not present

## 2022-02-04 DIAGNOSIS — E038 Other specified hypothyroidism: Secondary | ICD-10-CM | POA: Diagnosis not present

## 2022-02-04 DIAGNOSIS — M5010 Cervical disc disorder with radiculopathy, unspecified cervical region: Secondary | ICD-10-CM | POA: Diagnosis not present

## 2022-02-04 DIAGNOSIS — Z85038 Personal history of other malignant neoplasm of large intestine: Secondary | ICD-10-CM | POA: Diagnosis not present

## 2022-02-04 DIAGNOSIS — Z6832 Body mass index (BMI) 32.0-32.9, adult: Secondary | ICD-10-CM | POA: Diagnosis not present

## 2022-02-05 DIAGNOSIS — E1169 Type 2 diabetes mellitus with other specified complication: Secondary | ICD-10-CM | POA: Diagnosis not present

## 2022-02-08 DIAGNOSIS — Z4789 Encounter for other orthopedic aftercare: Secondary | ICD-10-CM | POA: Diagnosis not present

## 2022-02-17 DIAGNOSIS — H2513 Age-related nuclear cataract, bilateral: Secondary | ICD-10-CM | POA: Diagnosis not present

## 2022-02-17 DIAGNOSIS — E119 Type 2 diabetes mellitus without complications: Secondary | ICD-10-CM | POA: Diagnosis not present

## 2022-04-01 DIAGNOSIS — M961 Postlaminectomy syndrome, not elsewhere classified: Secondary | ICD-10-CM | POA: Diagnosis not present

## 2022-04-20 DIAGNOSIS — M542 Cervicalgia: Secondary | ICD-10-CM | POA: Diagnosis not present

## 2022-05-06 DIAGNOSIS — E1169 Type 2 diabetes mellitus with other specified complication: Secondary | ICD-10-CM | POA: Diagnosis not present

## 2022-05-06 DIAGNOSIS — Z79899 Other long term (current) drug therapy: Secondary | ICD-10-CM | POA: Diagnosis not present

## 2022-05-06 DIAGNOSIS — E038 Other specified hypothyroidism: Secondary | ICD-10-CM | POA: Diagnosis not present

## 2022-05-06 DIAGNOSIS — M150 Primary generalized (osteo)arthritis: Secondary | ICD-10-CM | POA: Diagnosis not present

## 2022-05-06 DIAGNOSIS — Z0000 Encounter for general adult medical examination without abnormal findings: Secondary | ICD-10-CM | POA: Diagnosis not present

## 2022-05-06 DIAGNOSIS — Z6833 Body mass index (BMI) 33.0-33.9, adult: Secondary | ICD-10-CM | POA: Diagnosis not present

## 2022-05-06 DIAGNOSIS — E669 Obesity, unspecified: Secondary | ICD-10-CM | POA: Diagnosis not present

## 2022-05-06 DIAGNOSIS — I10 Essential (primary) hypertension: Secondary | ICD-10-CM | POA: Diagnosis not present

## 2022-05-06 DIAGNOSIS — M810 Age-related osteoporosis without current pathological fracture: Secondary | ICD-10-CM | POA: Diagnosis not present

## 2022-05-06 DIAGNOSIS — Z85038 Personal history of other malignant neoplasm of large intestine: Secondary | ICD-10-CM | POA: Diagnosis not present

## 2022-05-06 DIAGNOSIS — E7800 Pure hypercholesterolemia, unspecified: Secondary | ICD-10-CM | POA: Diagnosis not present

## 2022-05-10 DIAGNOSIS — Z4789 Encounter for other orthopedic aftercare: Secondary | ICD-10-CM | POA: Diagnosis not present

## 2022-05-10 DIAGNOSIS — M25562 Pain in left knee: Secondary | ICD-10-CM | POA: Diagnosis not present

## 2022-05-19 DIAGNOSIS — Z79899 Other long term (current) drug therapy: Secondary | ICD-10-CM | POA: Diagnosis not present

## 2022-05-26 DIAGNOSIS — G894 Chronic pain syndrome: Secondary | ICD-10-CM | POA: Diagnosis not present

## 2022-05-26 DIAGNOSIS — M542 Cervicalgia: Secondary | ICD-10-CM | POA: Diagnosis not present

## 2022-07-01 ENCOUNTER — Telehealth: Payer: Self-pay

## 2022-07-01 NOTE — Patient Outreach (Signed)
  Care Coordination   Initial Visit Note   07/01/2022 Name: [name] MRN: [id number] DOB: 09/16/1941  [name] is a 81 y.o. year old female who sees Greig Right, MD for primary care. I spoke with  Laurina Bustle by phone today.    What matters to the patients health and wellness today?  Placed call to patient to review [facility] care coordination program. Patient reports to me that she is doing well. Reports she has no concerns.  Reports she is walking with a cane and or a walker post knee replacement.  Denies any transportation or medication concerns. Denies needs at this time.    SDOH assessments and interventions completed:  No     Care Coordination Interventions Activated:  No  Care Coordination Interventions:  No, not indicated   Follow up plan: No further intervention required.   Encounter Outcome:  Pt. Refused   Tomasa Rand, RN, BSN, CEN [facility] [employer] [phone]

## 2022-07-05 DIAGNOSIS — Z4789 Encounter for other orthopedic aftercare: Secondary | ICD-10-CM | POA: Diagnosis not present

## 2022-07-14 DIAGNOSIS — M47812 Spondylosis without myelopathy or radiculopathy, cervical region: Secondary | ICD-10-CM | POA: Diagnosis not present

## 2022-07-14 DIAGNOSIS — G894 Chronic pain syndrome: Secondary | ICD-10-CM | POA: Diagnosis not present

## 2022-07-14 DIAGNOSIS — M5412 Radiculopathy, cervical region: Secondary | ICD-10-CM | POA: Diagnosis not present

## 2022-07-14 DIAGNOSIS — M961 Postlaminectomy syndrome, not elsewhere classified: Secondary | ICD-10-CM | POA: Diagnosis not present

## 2022-07-14 DIAGNOSIS — M5030 Other cervical disc degeneration, unspecified cervical region: Secondary | ICD-10-CM | POA: Diagnosis not present

## 2022-07-20 DIAGNOSIS — M25562 Pain in left knee: Secondary | ICD-10-CM | POA: Diagnosis not present

## 2022-07-20 DIAGNOSIS — M1711 Unilateral primary osteoarthritis, right knee: Secondary | ICD-10-CM | POA: Diagnosis not present

## 2022-07-20 DIAGNOSIS — Z96652 Presence of left artificial knee joint: Secondary | ICD-10-CM | POA: Diagnosis not present

## 2022-07-26 DIAGNOSIS — Z6835 Body mass index (BMI) 35.0-35.9, adult: Secondary | ICD-10-CM | POA: Diagnosis not present

## 2022-07-26 DIAGNOSIS — E7800 Pure hypercholesterolemia, unspecified: Secondary | ICD-10-CM | POA: Diagnosis not present

## 2022-07-26 DIAGNOSIS — E1169 Type 2 diabetes mellitus with other specified complication: Secondary | ICD-10-CM | POA: Diagnosis not present

## 2022-07-26 DIAGNOSIS — E669 Obesity, unspecified: Secondary | ICD-10-CM | POA: Diagnosis not present

## 2022-07-26 DIAGNOSIS — I10 Essential (primary) hypertension: Secondary | ICD-10-CM | POA: Diagnosis not present

## 2022-07-26 DIAGNOSIS — E038 Other specified hypothyroidism: Secondary | ICD-10-CM | POA: Diagnosis not present

## 2022-07-26 DIAGNOSIS — Z01810 Encounter for preprocedural cardiovascular examination: Secondary | ICD-10-CM | POA: Diagnosis not present

## 2022-07-26 DIAGNOSIS — K219 Gastro-esophageal reflux disease without esophagitis: Secondary | ICD-10-CM | POA: Diagnosis not present

## 2022-08-10 ENCOUNTER — Ambulatory Visit: Payer: Self-pay | Admitting: Student

## 2022-08-10 DIAGNOSIS — E119 Type 2 diabetes mellitus without complications: Secondary | ICD-10-CM

## 2022-08-11 DIAGNOSIS — M1711 Unilateral primary osteoarthritis, right knee: Secondary | ICD-10-CM | POA: Diagnosis not present

## 2022-08-13 DIAGNOSIS — Z23 Encounter for immunization: Secondary | ICD-10-CM | POA: Diagnosis not present

## 2022-08-23 NOTE — Progress Notes (Signed)
Anesthesia Review:  PCP: Cardiologist : Chest x-ray : EKG : Echo : Stress test: Cardiac Cath :  Activity level:  Sleep Study/ CPAP : Fasting Blood Sugar :      / Checks Blood Sugar -- times a day:   Blood Thinner/ Instructions /Last Dose: ASA / Instructions/ Last Dose :   DM- type  Hgba1c-

## 2022-08-23 NOTE — Patient Instructions (Signed)
SURGICAL WAITING ROOM VISITATION Patients having surgery or a procedure may have no more than 2 support people in the waiting area - these visitors may rotate.   Children under the age of 5 must have an adult with them who is not the patient. If the patient needs to stay at the hospital during part of their recovery, the visitor guidelines for inpatient rooms apply. Pre-op nurse will coordinate an appropriate time for 1 support person to accompany patient in pre-op.  This support person may not rotate.    Please refer to the [facility] website for the visitor guidelines for Inpatients (after your surgery is over and you are in a regular room).       Your procedure is scheduled on:  09/08/2022    Report to [facility] Main Entrance    Report to admitting at   0900AM   Call this number if you have problems the morning of surgery [phone]   Do not eat food :After Midnight.   After Midnight you may have the following liquids until _ 0830___ AM  DAY OF SURGERY  Water Non-Citrus Juices (without pulp, NO RED) Carbonated Beverages Black Coffee (NO MILK/CREAM OR CREAMERS, sugar ok)  Clear Tea (NO MILK/CREAM OR CREAMERS, sugar ok) regular and decaf                             Plain Jell-O (NO RED)                                           Fruit ices (not with fruit pulp, NO RED)                                     Popsicles (NO RED)                                                               Sports drinks like Gatorade (NO RED)                 The day of surgery:  Drink ONE (1) Pre-Surgery Clear Ensure or G2 at   0830 AM ( have completed by )  the morning of surgery. Drink in one sitting. Do not sip.  This drink was given to you during your hospital  pre-op appointment visit. Nothing else to drink after completing the  Pre-Surgery Clear Ensure or G2.          If you have questions, please contact your surgeon's office.        Oral Hygiene is also important to  reduce your risk of infection.                                    Remember - BRUSH YOUR TEETH THE MORNING OF SURGERY WITH YOUR REGULAR TOOTHPASTE   Do NOT smoke after Midnight   Take these medicines the morning of surgery with A SIP OF WATER:   DO NOT TAKE ANY ORAL  DIABETIC MEDICATIONS DAY OF YOUR SURGERY  Bring CPAP mask and tubing day of surgery.                              You may not have any metal on your body including hair pins, jewelry, and body piercing             Do not wear make-up, lotions, powders, perfumes/cologne, or deodorant  Do not wear nail polish including gel and S&S, artificial/acrylic nails, or any other type of covering on natural nails including finger and toenails. If you have artificial nails, gel coating, etc. that needs to be removed by a nail salon please have this removed prior to surgery or surgery may need to be canceled/ delayed if the surgeon/ anesthesia feels like they are unable to be safely monitored.   Do not shave  48 hours prior to surgery.               Men may shave face and neck.   Do not bring valuables to the hospital. [place].   Contacts, dentures or bridgework may not be worn into surgery.   Bring small overnight bag day of surgery.   DO NOT [place]. PHARMACY WILL DISPENSE MEDICATIONS LISTED ON YOUR MEDICATION LIST TO YOU DURING YOUR ADMISSION [place]!    Patients discharged on the day of surgery will not be allowed to drive home.  Someone NEEDS to stay with you for the first 24 hours after anesthesia.   Special Instructions: Bring a copy of your healthcare power of attorney and living will documents the day of surgery if you haven't scanned them before.              Please read over the following fact sheets you were given: IF [place] [phone]   If you received a COVID test  during your pre-op visit  it is requested that you wear a mask when out in public, stay away from anyone that may not be feeling well and notify your surgeon if you develop symptoms. If you test positive for Covid or have been in contact with anyone that has tested positive in the last 10 days please notify you surgeon.     [place] - Preparing for Surgery Before surgery, you can play an important role.  Because skin is not sterile, your skin needs to be as free of germs as possible.  You can reduce the number of germs on your skin by washing with CHG (chlorahexidine gluconate) soap before surgery.  CHG is an antiseptic cleaner which kills germs and bonds with the skin to continue killing germs even after washing. Please DO NOT use if you have an allergy to CHG or antibacterial soaps.  If your skin becomes reddened/irritated stop using the CHG and inform your nurse when you arrive at Short Stay. Do not shave (including legs and underarms) for at least 48 hours prior to the first CHG shower.  You may shave your face/neck. Please follow these instructions carefully:  1.  Shower with CHG Soap the night before surgery and the  morning of Surgery.  2.  If you choose to wash your hair, wash your hair first as usual with your  normal  shampoo.  3.  After you shampoo, rinse your hair and body thoroughly to remove the  shampoo.                           4.  Use CHG as you would any other liquid soap.  You can apply chg directly  to the skin and wash                       Gently with a scrungie or clean washcloth.  5.  Apply the CHG Soap to your body ONLY FROM THE NECK DOWN.   Do not use on face/ open                           Wound or open sores. Avoid contact with eyes, ears mouth and genitals (private parts).                       Wash face,  Genitals (private parts) with your normal soap.             6.  Wash thoroughly, paying special attention to the area where your surgery  will be performed.  7.   Thoroughly rinse your body with warm water from the neck down.  8.  DO NOT shower/wash with your normal soap after using and rinsing off  the CHG Soap.                9.  Pat yourself dry with a clean towel.            10.  Wear clean pajamas.            11.  Place clean sheets on your bed the night of your first shower and do not  sleep with pets. Day of Surgery : Do not apply any lotions/deodorants the morning of surgery.  Please wear clean clothes to the hospital/surgery center.  FAILURE TO FOLLOW THESE INSTRUCTIONS MAY RESULT IN THE CANCELLATION OF YOUR SURGERY PATIENT SIGNATURE_________________________________  NURSE SIGNATURE__________________________________  ________________________________________________________________________

## 2022-08-25 DIAGNOSIS — L821 Other seborrheic keratosis: Secondary | ICD-10-CM | POA: Diagnosis not present

## 2022-08-25 DIAGNOSIS — L570 Actinic keratosis: Secondary | ICD-10-CM | POA: Diagnosis not present

## 2022-08-25 DIAGNOSIS — L578 Other skin changes due to chronic exposure to nonionizing radiation: Secondary | ICD-10-CM | POA: Diagnosis not present

## 2022-08-25 DIAGNOSIS — C44712 Basal cell carcinoma of skin of right lower limb, including hip: Secondary | ICD-10-CM | POA: Diagnosis not present

## 2022-08-30 ENCOUNTER — Encounter (HOSPITAL_COMMUNITY)
Admission: RE | Admit: 2022-08-30 | Discharge: 2022-08-30 | Disposition: A | Discharge status: Home / Self Care | Payer: PPO | Source: Ambulatory Visit | Attending: Orthopedic Surgery | Admitting: Orthopedic Surgery

## 2022-08-30 DIAGNOSIS — Z01818 Encounter for other preprocedural examination: Secondary | ICD-10-CM

## 2022-09-07 DIAGNOSIS — M21962 Unspecified acquired deformity of left lower leg: Secondary | ICD-10-CM | POA: Diagnosis not present

## 2022-09-07 DIAGNOSIS — M25561 Pain in right knee: Secondary | ICD-10-CM | POA: Diagnosis not present

## 2022-09-07 DIAGNOSIS — Z96652 Presence of left artificial knee joint: Secondary | ICD-10-CM | POA: Diagnosis not present

## 2022-09-07 DIAGNOSIS — M1711 Unilateral primary osteoarthritis, right knee: Secondary | ICD-10-CM | POA: Diagnosis not present

## 2022-09-07 DIAGNOSIS — G8929 Other chronic pain: Secondary | ICD-10-CM | POA: Diagnosis not present

## 2022-09-07 DIAGNOSIS — T8484XA Pain due to internal orthopedic prosthetic devices, implants and grafts, initial encounter: Secondary | ICD-10-CM | POA: Diagnosis not present

## 2022-09-07 DIAGNOSIS — M13861 Other specified arthritis, right knee: Secondary | ICD-10-CM | POA: Diagnosis not present

## 2022-09-08 ENCOUNTER — Inpatient Hospital Stay: Admit: 2022-09-08 | Payer: PPO | Admitting: Orthopedic Surgery

## 2022-09-08 SURGERY — TOTAL KNEE REVISION
Anesthesia: Spinal | Site: Knee | Laterality: Left

## 2022-10-20 DIAGNOSIS — R300 Dysuria: Secondary | ICD-10-CM | POA: Diagnosis not present

## 2022-10-20 DIAGNOSIS — R110 Nausea: Secondary | ICD-10-CM | POA: Diagnosis not present

## 2022-11-04 DIAGNOSIS — F5101 Primary insomnia: Secondary | ICD-10-CM | POA: Diagnosis not present

## 2022-11-19 DIAGNOSIS — M25562 Pain in left knee: Secondary | ICD-10-CM | POA: Diagnosis not present

## 2022-11-19 DIAGNOSIS — M25561 Pain in right knee: Secondary | ICD-10-CM | POA: Diagnosis not present

## 2022-11-25 DIAGNOSIS — R110 Nausea: Secondary | ICD-10-CM | POA: Diagnosis not present

## 2022-11-30 DIAGNOSIS — E7800 Pure hypercholesterolemia, unspecified: Secondary | ICD-10-CM | POA: Diagnosis not present

## 2022-11-30 DIAGNOSIS — F3342 Major depressive disorder, recurrent, in full remission: Secondary | ICD-10-CM | POA: Diagnosis not present

## 2022-11-30 DIAGNOSIS — E038 Other specified hypothyroidism: Secondary | ICD-10-CM | POA: Diagnosis not present

## 2022-11-30 DIAGNOSIS — K219 Gastro-esophageal reflux disease without esophagitis: Secondary | ICD-10-CM | POA: Diagnosis not present

## 2022-11-30 DIAGNOSIS — Z01810 Encounter for preprocedural cardiovascular examination: Secondary | ICD-10-CM | POA: Diagnosis not present

## 2022-11-30 DIAGNOSIS — E1169 Type 2 diabetes mellitus with other specified complication: Secondary | ICD-10-CM | POA: Diagnosis not present

## 2022-11-30 DIAGNOSIS — I10 Essential (primary) hypertension: Secondary | ICD-10-CM | POA: Diagnosis not present

## 2022-12-01 DIAGNOSIS — K760 Fatty (change of) liver, not elsewhere classified: Secondary | ICD-10-CM | POA: Diagnosis not present

## 2022-12-01 DIAGNOSIS — R110 Nausea: Secondary | ICD-10-CM | POA: Diagnosis not present

## 2022-12-01 DIAGNOSIS — N281 Cyst of kidney, acquired: Secondary | ICD-10-CM | POA: Diagnosis not present

## 2022-12-01 DIAGNOSIS — K8020 Calculus of gallbladder without cholecystitis without obstruction: Secondary | ICD-10-CM | POA: Diagnosis not present

## 2022-12-02 DIAGNOSIS — L814 Other melanin hyperpigmentation: Secondary | ICD-10-CM | POA: Diagnosis not present

## 2022-12-02 DIAGNOSIS — L578 Other skin changes due to chronic exposure to nonionizing radiation: Secondary | ICD-10-CM | POA: Diagnosis not present

## 2022-12-02 DIAGNOSIS — L820 Inflamed seborrheic keratosis: Secondary | ICD-10-CM | POA: Diagnosis not present

## 2022-12-02 DIAGNOSIS — C44712 Basal cell carcinoma of skin of right lower limb, including hip: Secondary | ICD-10-CM | POA: Diagnosis not present

## 2022-12-02 DIAGNOSIS — L570 Actinic keratosis: Secondary | ICD-10-CM | POA: Diagnosis not present

## 2023-01-04 DIAGNOSIS — M1711 Unilateral primary osteoarthritis, right knee: Secondary | ICD-10-CM | POA: Diagnosis not present

## 2023-01-05 ENCOUNTER — Ambulatory Visit: Payer: Self-pay | Admitting: Physician Assistant

## 2023-01-05 DIAGNOSIS — G8929 Other chronic pain: Secondary | ICD-10-CM

## 2023-01-05 NOTE — H&P (View-Only) (Signed)
TOTAL KNEE ADMISSION H&P  Patient is being admitted for right total knee arthroplasty.  Subjective:  Chief Complaint:right knee pain.  HPI: [name], 82 y.o. female, has a history of pain and functional disability in the right knee due to arthritis and has failed non-surgical conservative treatments for greater than 12 weeks to includeNSAID's and/or analgesics, corticosteriod injections, use of assistive devices, and activity modification.  Onset of symptoms was gradual, starting 8 years ago with gradually worsening course since that time. The patient noted no past surgery on the right knee(s).  Patient currently rates pain in the right knee(s) at 9 out of 10 with activity. Patient has night pain, worsening of pain with activity and weight bearing, pain that interferes with activities of daily living, pain with passive range of motion, crepitus, and joint swelling.  Patient has evidence of periarticular osteophytes and joint space narrowing by imaging studies. There is no active infection.  Patient Active Problem List   Diagnosis Date Noted  . Osteoarthritis of left knee 11/11/2021  . Chronic pain 07/08/2021  . Hypertension   . Hyperlipidemia   . Type 2 diabetes mellitus (HCC)   . Hypothyroidism   . Colon cancer (HCC)   . AVNRT (AV nodal re-entry tachycardia)   . SVT (supraventricular tachycardia) 08/21/2014  . HTN (hypertension) 08/21/2014  . Elevated lipids 08/21/2014  . DM (diabetes mellitus) type 2, uncontrolled, with ketoacidosis (HCC) 08/21/2014   Past Medical History:  Diagnosis Date  . Arthritis   . AVNRT (AV nodal re-entry tachycardia) (HCC)    a. s/p catheter ablation on 09/30/14  . Colon cancer (HCC)    stage 1  . Dysrhythmia   . Hyperlipidemia   . Hypertension   . Hypothyroidism   . Shingles   . Squamous cell carcinoma   . Supraventricular tachycardia (HCC)   . Type 2 diabetes mellitus (HCC)   . Ulcerative colitis (HCC)    intermittent mild flares     Past Surgical History:  Procedure Laterality Date  . COLON RESECTION  10/26/1999   Partial  . COLONOSCOPY    . HIP ARTHROPLASTY Left   . LUMBAR FUSION    . SPINAL CORD STIMULATOR INSERTION N/A 07/08/2021   Procedure: SPINAL CORD STIMULATOR PLACEMENT;  Surgeon: Brooks, Dahari, MD;  Location: [facility] OR;  Service: Orthopedics;  Laterality: N/A;  2.5 HRS 3C-BED  . SUPRAVENTRICULAR TACHYCARDIA ABLATION N/A 09/30/2014   Procedure: SUPRAVENTRICULAR TACHYCARDIA ABLATION;  Surgeon: Gregg W Taylor, MD;  Location: [facility] CATH LAB;  Service: Cardiovascular;  Laterality: N/A;  . TOTAL KNEE ARTHROPLASTY Left 11/11/2021   Procedure: TOTAL KNEE ARTHROPLASTY;  Surgeon: Collins, Robert, MD;  Location: [facility] ORS;  Service: Orthopedics;  Laterality: Left;  with adductor canal patient has spinal cord stimulator 120min    Current Outpatient Medications  Medication Sig Dispense Refill Last Dose  . bisacodyl (DULCOLAX) 5 MG EC tablet Take 1 tablet (5 mg total) by mouth daily as needed for moderate constipation. 30 tablet 0   . CALCIUM-VITAMIN D PO Take 1 tablet by mouth in the morning and at bedtime.     . diltiazem (DILACOR XR) 240 MG 24 hr capsule Take 240 mg by mouth daily.     . Dulaglutide 3 MG/0.5ML SOPN Inject 3 mg into the skin every Saturday.     . empagliflozin (JARDIANCE) 25 MG TABS tablet Take 25 mg by mouth daily.     . fluticasone (FLONASE) 50 MCG/ACT nasal spray Place 2 sprays into both nostrils daily as   needed for allergies or rhinitis.     . glimepiride (AMARYL) 4 MG tablet Take 4 mg by mouth 2 (two) times daily.      . levothyroxine (SYNTHROID, LEVOTHROID) 112 MCG tablet Take 112 mcg by mouth daily before breakfast.     . meloxicam (MOBIC) 7.5 MG tablet Take 7.5 mg by mouth in the morning and at bedtime.     . methocarbamol (ROBAXIN) 500 MG tablet Take 1-2 tabs PO q 6 hrs prn muscle spasms 60 tablet 0   . Multiple Vitamins-Minerals (MULTIVITAMIN WITH MINERALS) tablet Take 1 tablet by mouth daily.     .  omeprazole (PRILOSEC) 40 MG capsule Take 40 mg by mouth at bedtime.     . ondansetron (ZOFRAN) 4 MG tablet Take 1 tablet (4 mg total) by mouth every 8 (eight) hours as needed for nausea or vomiting. 20 tablet 0   . ondansetron (ZOFRAN) 4 MG tablet Take 1 tablet (4 mg total) by mouth every 6 (six) hours as needed for nausea. 20 tablet 0   . oxyCODONE (OXY IR/ROXICODONE) 5 MG immediate release tablet Take 1-2 tablets (5-10 mg total) by mouth every 4 (four) hours as needed for moderate pain or severe pain (pain score 4-6). 60 tablet 0   . pregabalin (LYRICA) 150 MG capsule Take 150 mg by mouth 2 (two) times daily.     . rosuvastatin (CRESTOR) 20 MG tablet Take 20 mg by mouth daily.     . traMADol (ULTRAM) 50 MG tablet Take 1 tablet (50 mg total) by mouth every 6 (six) hours. 50 tablet 0    No current facility-administered medications for this visit.   Allergies  Allergen Reactions  . Cortisone Acetate [Cortisone] Other (See Comments)    Hyperglycemia(adverse Reaction)  . Metformin And Related Diarrhea  . Misoprostol Diarrhea  . Fosamax [Alendronate Sodium] Nausea And Vomiting  . Gabapentin Nausea And Vomiting  . Tape     Pulls skin off    Social History   Tobacco Use  . Smoking status: Former  . Smokeless tobacco: Never  Substance Use Topics  . Alcohol use: No    Family History  Problem Relation Age of Onset  . Cancer Father   . Heart attack Mother   . Diabetes Mother   . Arrhythmia Brother   . Cancer Sister   . Diabetes Sister      Review of Systems  Musculoskeletal:  Positive for arthralgias.  All other systems reviewed and are negative.  Objective:  Physical Exam Constitutional:      General: She is not in acute distress.    Appearance: Normal appearance.  HENT:     Head: Normocephalic and atraumatic.  Eyes:     Extraocular Movements: Extraocular movements intact.     Pupils: Pupils are equal, round, and reactive to light.  Cardiovascular:     Rate and Rhythm:  Normal rate and regular rhythm.     Pulses: Normal pulses.     Heart sounds: Murmur heard.  Pulmonary:     Effort: Pulmonary effort is normal. No respiratory distress.     Breath sounds: Normal breath sounds. No wheezing.  Abdominal:     General: Abdomen is flat. Bowel sounds are normal. There is no distension.     Palpations: Abdomen is soft.     Tenderness: There is no abdominal tenderness.  Musculoskeletal:     Cervical back: Normal range of motion and neck supple.     Right knee: Swelling, deformity   and bony tenderness present. No effusion or erythema. Tenderness present. Abnormal alignment.  Lymphadenopathy:     Cervical: No cervical adenopathy.  Skin:    General: Skin is warm and dry.     Findings: No erythema or rash.  Neurological:     General: No focal deficit present.     Mental Status: She is alert and oriented to person, place, and time.  Psychiatric:        Mood and Affect: Mood normal.        Behavior: Behavior normal.   Vital signs in last 24 hours: @VSRANGES@  Labs:   Estimated body mass index is 36.06 kg/m as calculated from the following:   Height as of 11/11/21: 5' 4" (1.626 m).   Weight as of 11/11/21: 95.3 kg.   Imaging Review Plain radiographs demonstrate severe degenerative joint disease of the right knee(s). The overall alignment issignificant valgus. The bone quality appears to be good for age and reported activity level.      Assessment/Plan:  End stage arthritis, right knee   The patient history, physical examination, clinical judgment of the provider and imaging studies are consistent with end stage degenerative joint disease of the right knee(s) and total knee arthroplasty is deemed medically necessary. The treatment options including medical management, injection therapy arthroscopy and arthroplasty were discussed at length. The risks and benefits of total knee arthroplasty were presented and reviewed. The risks due to aseptic loosening,  infection, stiffness, patella tracking problems, thromboembolic complications and other imponderables were discussed. The patient acknowledged the explanation, agreed to proceed with the plan and consent was signed. Patient is being admitted for inpatient treatment for surgery, pain control, PT, OT, prophylactic antibiotics, VTE prophylaxis, progressive ambulation and ADL's and discharge planning. The patient is planning to be discharged home with home health services    Anticipated LOS equal to or greater than 2 midnights due to - Age 65 and older with one or more of the following:  - Obesity  - Expected need for hospital services (PT, OT, Nursing) required for safe  discharge  - Anticipated need for postoperative skilled nursing care or inpatient rehab  - Active co-morbidities: Diabetes and Cardiac Arrhythmia OR   - Unanticipated findings during/Post Surgery: None  - Patient is a high risk of re-admission due to: None 

## 2023-01-05 NOTE — Progress Notes (Signed)
Sent message, via epic in basket, requesting orders in epic from surgeon.  

## 2023-01-05 NOTE — H&P (Signed)
TOTAL KNEE ADMISSION H&P  Patient is being admitted for right total knee arthroplasty.  Subjective:  Chief Complaint:right knee pain.  HPI: [name], 82 y.o. female, has a history of pain and functional disability in the right knee due to arthritis and has failed non-surgical conservative treatments for greater than 12 weeks to includeNSAID's and/or analgesics, corticosteriod injections, use of assistive devices, and activity modification.  Onset of symptoms was gradual, starting 8 years ago with gradually worsening course since that time. The patient noted no past surgery on the right knee(s).  Patient currently rates pain in the right knee(s) at 9 out of 10 with activity. Patient has night pain, worsening of pain with activity and weight bearing, pain that interferes with activities of daily living, pain with passive range of motion, crepitus, and joint swelling.  Patient has evidence of periarticular osteophytes and joint space narrowing by imaging studies. There is no active infection.  Patient Active Problem List   Diagnosis Date Noted  . Osteoarthritis of left knee 11/11/2021  . Chronic pain 07/08/2021  . Hypertension   . Hyperlipidemia   . Type 2 diabetes mellitus ([place])   . Hypothyroidism   . Colon cancer ([place])   . AVNRT (AV nodal re-entry tachycardia)   . SVT (supraventricular tachycardia) 08/21/2014  . HTN (hypertension) 08/21/2014  . Elevated lipids 08/21/2014  . DM (diabetes mellitus) type 2, uncontrolled, with ketoacidosis ([place]) 08/21/2014   Past Medical History:  Diagnosis Date  . Arthritis   . AVNRT (AV nodal re-entry tachycardia) ([place])    a. s/p catheter ablation on 09/30/14  . Colon cancer ([place])    stage 1  . Dysrhythmia   . Hyperlipidemia   . Hypertension   . Hypothyroidism   . Shingles   . Squamous cell carcinoma   . Supraventricular tachycardia ([place])   . Type 2 diabetes mellitus ([place])   . Ulcerative colitis ([place])    intermittent mild flares     Past Surgical History:  Procedure Laterality Date  . COLON RESECTION  10/26/1999   Partial  . COLONOSCOPY    . HIP ARTHROPLASTY Left   . LUMBAR FUSION    . SPINAL CORD STIMULATOR INSERTION N/A 07/08/2021   Procedure: SPINAL CORD STIMULATOR PLACEMENT;  Surgeon: Melina Schools, MD;  Location: [facility];  Service: Orthopedics;  Laterality: N/A;  2.5 HRS 3C-BED  . SUPRAVENTRICULAR TACHYCARDIA ABLATION N/A 09/30/2014   Procedure: SUPRAVENTRICULAR TACHYCARDIA ABLATION;  Surgeon: Evans Lance, MD;  Location: [facility] CATH LAB;  Service: Cardiovascular;  Laterality: N/A;  . TOTAL KNEE ARTHROPLASTY Left 11/11/2021   Procedure: TOTAL KNEE ARTHROPLASTY;  Surgeon: Sydnee Cabal, MD;  Location: [facility] ORS;  Service: Orthopedics;  Laterality: Left;  with adductor canal patient has spinal cord stimulator 19mn    Current Outpatient Medications  Medication Sig Dispense Refill Last Dose  . bisacodyl (DULCOLAX) 5 MG EC tablet Take 1 tablet (5 mg total) by mouth daily as needed for moderate constipation. 30 tablet 0   . CALCIUM-VITAMIN D PO Take 1 tablet by mouth in the morning and at bedtime.     .Marland Kitchendiltiazem (DILACOR XR) 240 MG 24 hr capsule Take 240 mg by mouth daily.     . Dulaglutide 3 MG/0.5ML SOPN Inject 3 mg into the skin every Saturday.     . empagliflozin (JARDIANCE) 25 MG TABS tablet Take 25 mg by mouth daily.     . fluticasone (FLONASE) 50 MCG/ACT nasal spray Place 2 sprays into both nostrils daily as  needed for allergies or rhinitis.     Marland Kitchen glimepiride (AMARYL) 4 MG tablet Take 4 mg by mouth 2 (two) times daily.      Marland Kitchen levothyroxine (SYNTHROID, LEVOTHROID) 112 MCG tablet Take 112 mcg by mouth daily before breakfast.     . meloxicam (MOBIC) 7.5 MG tablet Take 7.5 mg by mouth in the morning and at bedtime.     . methocarbamol (ROBAXIN) 500 MG tablet Take 1-2 tabs PO q 6 hrs prn muscle spasms 60 tablet 0   . Multiple Vitamins-Minerals (MULTIVITAMIN WITH MINERALS) tablet Take 1 tablet by mouth daily.     Marland Kitchen  omeprazole (PRILOSEC) 40 MG capsule Take 40 mg by mouth at bedtime.     . ondansetron (ZOFRAN) 4 MG tablet Take 1 tablet (4 mg total) by mouth every 8 (eight) hours as needed for nausea or vomiting. 20 tablet 0   . ondansetron (ZOFRAN) 4 MG tablet Take 1 tablet (4 mg total) by mouth every 6 (six) hours as needed for nausea. 20 tablet 0   . oxyCODONE (OXY IR/ROXICODONE) 5 MG immediate release tablet Take 1-2 tablets (5-10 mg total) by mouth every 4 (four) hours as needed for moderate pain or severe pain (pain score 4-6). 60 tablet 0   . pregabalin (LYRICA) 150 MG capsule Take 150 mg by mouth 2 (two) times daily.     . rosuvastatin (CRESTOR) 20 MG tablet Take 20 mg by mouth daily.     . traMADol (ULTRAM) 50 MG tablet Take 1 tablet (50 mg total) by mouth every 6 (six) hours. 50 tablet 0    No current facility-administered medications for this visit.   Allergies  Allergen Reactions  . Cortisone Acetate [Cortisone] Other (See Comments)    Hyperglycemia(adverse Reaction)  . Metformin And Related Diarrhea  . Misoprostol Diarrhea  . Fosamax [Alendronate Sodium] Nausea And Vomiting  . Gabapentin Nausea And Vomiting  . Tape     Pulls skin off    Social History   Tobacco Use  . Smoking status: Former  . Smokeless tobacco: Never  Substance Use Topics  . Alcohol use: No    Family History  Problem Relation Age of Onset  . Cancer Father   . Heart attack Mother   . Diabetes Mother   . Arrhythmia Brother   . Cancer Sister   . Diabetes Sister      Review of Systems  Musculoskeletal:  Positive for arthralgias.  All other systems reviewed and are negative.  Objective:  Physical Exam Constitutional:      General: She is not in acute distress.    Appearance: Normal appearance.  HENT:     Head: Normocephalic and atraumatic.  Eyes:     Extraocular Movements: Extraocular movements intact.     Pupils: Pupils are equal, round, and reactive to light.  Cardiovascular:     Rate and Rhythm:  Normal rate and regular rhythm.     Pulses: Normal pulses.     Heart sounds: Murmur heard.  Pulmonary:     Effort: Pulmonary effort is normal. No respiratory distress.     Breath sounds: Normal breath sounds. No wheezing.  Abdominal:     General: Abdomen is flat. Bowel sounds are normal. There is no distension.     Palpations: Abdomen is soft.     Tenderness: There is no abdominal tenderness.  Musculoskeletal:     Cervical back: Normal range of motion and neck supple.     Right knee: Swelling, deformity  and bony tenderness present. No effusion or erythema. Tenderness present. Abnormal alignment.  Lymphadenopathy:     Cervical: No cervical adenopathy.  Skin:    General: Skin is warm and dry.     Findings: No erythema or rash.  Neurological:     General: No focal deficit present.     Mental Status: She is alert and oriented to person, place, and time.  Psychiatric:        Mood and Affect: Mood normal.        Behavior: Behavior normal.   Vital signs in last 24 hours: '@VSRANGES'$ @  Labs:   Estimated body mass index is 36.06 kg/m as calculated from the following:   Height as of 11/11/21: '5\' 4"'$  (1.626 m).   Weight as of 11/11/21: 95.3 kg.   Imaging Review Plain radiographs demonstrate severe degenerative joint disease of the right knee(s). The overall alignment issignificant valgus. The bone quality appears to be good for age and reported activity level.      Assessment/Plan:  End stage arthritis, right knee   The patient history, physical examination, clinical judgment of the provider and imaging studies are consistent with end stage degenerative joint disease of the right knee(s) and total knee arthroplasty is deemed medically necessary. The treatment options including medical management, injection therapy arthroscopy and arthroplasty were discussed at length. The risks and benefits of total knee arthroplasty were presented and reviewed. The risks due to aseptic loosening,  infection, stiffness, patella tracking problems, thromboembolic complications and other imponderables were discussed. The patient acknowledged the explanation, agreed to proceed with the plan and consent was signed. Patient is being admitted for inpatient treatment for surgery, pain control, PT, OT, prophylactic antibiotics, VTE prophylaxis, progressive ambulation and ADL's and discharge planning. The patient is planning to be discharged home with home health services    Anticipated LOS equal to or greater than 2 midnights due to - Age 12 and older with one or more of the following:  - Obesity  - Expected need for hospital services (PT, OT, Nursing) required for safe  discharge  - Anticipated need for postoperative skilled nursing care or inpatient rehab  - Active co-morbidities: Diabetes and Cardiac Arrhythmia OR   - Unanticipated findings during/Post Surgery: None  - Patient is a high risk of re-admission due to: None

## 2023-01-11 NOTE — Progress Notes (Signed)
Anesthesia Review:  PCP: Greig Right - clearance 12/03/22 Labs 12/01/22 hgba1c- 12/01/22- 5.6 LOV 11/30/22 on chart  Cardiologist : Chest x-ray : EKG : Echo : Stress test: Cardiac Cath :  Activity level:  Sleep Study/ CPAP : Fasting Blood Sugar :      / Checks Blood Sugar -- times a day:   Blood Thinner/ Instructions /Last Dose: ASA / Instructions/ Last Dose :    DM- type 2  Hgba1c- 5.6 12/01/22 on chart  Amaryl Trulicity Jardiance

## 2023-01-11 NOTE — Patient Instructions (Signed)
SURGICAL WAITING ROOM VISITATION  Patients having surgery or a procedure may have no more than 2 support people in the waiting area - these visitors may rotate.    Children under the age of 59 must have an adult with them who is not the patient.  Due to an increase in RSV and influenza rates and associated hospitalizations, children ages 55 and under may not visit patients in [place].  If the patient needs to stay at the hospital during part of their recovery, the visitor guidelines for inpatient rooms apply. Pre-op nurse will coordinate an appropriate time for 1 support person to accompany patient in pre-op.  This support person may not rotate.    Please refer to the [facility] website for the visitor guidelines for Inpatients (after your surgery is over and you are in a regular room).       Your procedure is scheduled on:  01/24/23    Report to [facility] Main Entrance    Report to admitting at    1030 AM   Call this number if you have problems the morning of surgery [phone]   Do not eat food :After Midnight.   After Midnight you may have the following liquids until _ 1000_____ AM DAY OF SURGERY  Water Non-Citrus Juices (without pulp, NO RED-Apple, White grape, White cranberry) Black Coffee (NO MILK/CREAM OR CREAMERS, sugar ok)  Clear Tea (NO MILK/CREAM OR CREAMERS, sugar ok) regular and decaf                             Plain Jell-O (NO RED)                                           Fruit ices (not with fruit pulp, NO RED)                                     Popsicles (NO RED)                                                               Sports drinks like Gatorade (NO RED)                    The day of surgery:  Drink ONE (1) Pre-Surgery Clear Ensure or G2 at  1000 AM ( have completed by )  the morning of surgery. Drink in one sitting. Do not sip.  This drink was given to you during your hospital  pre-op appointment visit. Nothing else to  drink after completing the  Pre-Surgery Clear Ensure or G2.          If you have questions, please contact your surgeon's office.       Oral Hygiene is also important to reduce your risk of infection.                                    Remember - BRUSH YOUR TEETH THE MORNING OF  SURGERY WITH YOUR REGULAR TOOTHPASTE  DENTURES WILL BE REMOVED PRIOR TO SURGERY PLEASE DO NOT APPLY "Poly grip" OR ADHESIVES!!!   Do NOT smoke after Midnight   Take these medicines the morning of surgery with A SIP OF WATER:  citalopram, cardizem, synthroid, protonix             Jardiance- Hold for 72 hours prior to surgery.           Trulicity- Hold for 7 days prior to surgery .           Amaryl- None day of surgery   DO NOT TAKE ANY ORAL DIABETIC MEDICATIONS DAY OF YOUR SURGERY  Bring CPAP mask and tubing day of surgery.                              You may not have any metal on your body including hair pins, jewelry, and body piercing             Do not wear make-up, lotions, powders, perfumes/cologne, or deodorant  Do not wear nail polish including gel and S&S, artificial/acrylic nails, or any other type of covering on natural nails including finger and toenails. If you have artificial nails, gel coating, etc. that needs to be removed by a nail salon please have this removed prior to surgery or surgery may need to be canceled/ delayed if the surgeon/ anesthesia feels like they are unable to be safely monitored.   Do not shave  48 hours prior to surgery.               Men may shave face and neck.   Do not bring valuables to the hospital. [place].   Contacts, glasses, dentures or bridgework may not be worn into surgery.   Bring small overnight bag day of surgery.   DO NOT [place]. PHARMACY WILL DISPENSE MEDICATIONS LISTED ON YOUR MEDICATION LIST TO YOU DURING YOUR ADMISSION [place]!    Patients  discharged on the day of surgery will not be allowed to drive home.  Someone NEEDS to stay with you for the first 24 hours after anesthesia.   Special Instructions: Bring a copy of your healthcare power of attorney and living will documents the day of surgery if you haven't scanned them before.              Please read over the following fact sheets you were given: IF [place] [phone]   If you received a COVID test during your pre-op visit  it is requested that you wear a mask when out in public, stay away from anyone that may not be feeling well and notify your surgeon if you develop symptoms. If you test positive for Covid or have been in contact with anyone that has tested positive in the last 10 days please notify you surgeon.     - Preparing for Surgery Before surgery, you can play an important role.  Because skin is not sterile, your skin needs to be as free of germs as possible.  You can reduce the number of germs on your skin by washing with CHG (chlorahexidine gluconate) soap before surgery.  CHG is an antiseptic cleaner which kills germs and bonds with the skin  to continue killing germs even after washing. Please DO NOT use if you have an allergy to CHG or antibacterial soaps.  If your skin becomes reddened/irritated stop using the CHG and inform your nurse when you arrive at Short Stay. Do not shave (including legs and underarms) for at least 48 hours prior to the first CHG shower.  You may shave your face/neck. Please follow these instructions carefully:  1.  Shower with CHG Soap the night before surgery and the  morning of Surgery.  2.  If you choose to wash your hair, wash your hair first as usual with your  normal  shampoo.  3.  After you shampoo, rinse your hair and body thoroughly to remove the  shampoo.                           4.  Use CHG as you would any other liquid soap.  You can apply chg directly  to the  skin and wash                       Gently with a scrungie or clean washcloth.  5.  Apply the CHG Soap to your body ONLY FROM THE NECK DOWN.   Do not use on face/ open                           Wound or open sores. Avoid contact with eyes, ears mouth and genitals (private parts).                       Wash face,  Genitals (private parts) with your normal soap.             6.  Wash thoroughly, paying special attention to the area where your surgery  will be performed.  7.  Thoroughly rinse your body with warm water from the neck down.  8.  DO NOT shower/wash with your normal soap after using and rinsing off  the CHG Soap.                9.  Pat yourself dry with a clean towel.            10.  Wear clean pajamas.            11.  Place clean sheets on your bed the night of your first shower and do not  sleep with pets. Day of Surgery : Do not apply any lotions/deodorants the morning of surgery.  Please wear clean clothes to the hospital/surgery center.  FAILURE TO FOLLOW THESE INSTRUCTIONS MAY RESULT IN THE CANCELLATION OF YOUR SURGERY PATIENT SIGNATURE_________________________________  NURSE SIGNATURE__________________________________  ________________________________________________________________________

## 2023-01-12 ENCOUNTER — Other Ambulatory Visit: Payer: Self-pay

## 2023-01-12 ENCOUNTER — Encounter (HOSPITAL_COMMUNITY): Payer: Self-pay

## 2023-01-12 ENCOUNTER — Encounter (HOSPITAL_COMMUNITY)
Admission: RE | Admit: 2023-01-12 | Discharge: 2023-01-12 | Disposition: A | Discharge status: Home / Self Care | Payer: PPO | Source: Ambulatory Visit | Attending: Orthopedic Surgery | Admitting: Orthopedic Surgery

## 2023-01-12 VITALS — BP 113/62 | HR 79 | Temp 98.4°F | Resp 16 | Ht 65.0 in | Wt 202.0 lb

## 2023-01-12 DIAGNOSIS — M25561 Pain in right knee: Secondary | ICD-10-CM | POA: Diagnosis not present

## 2023-01-12 DIAGNOSIS — Z01818 Encounter for other preprocedural examination: Secondary | ICD-10-CM | POA: Insufficient documentation

## 2023-01-12 DIAGNOSIS — G8929 Other chronic pain: Secondary | ICD-10-CM | POA: Diagnosis not present

## 2023-01-12 HISTORY — DX: Cardiac murmur, unspecified: R01.1

## 2023-01-12 LAB — COMPREHENSIVE METABOLIC PANEL
ALT: 103 U/L — ABNORMAL HIGH (ref 0–44)
AST: 70 U/L — ABNORMAL HIGH (ref 15–41)
Albumin: 4.4 g/dL (ref 3.5–5.0)
Alkaline Phosphatase: 62 U/L (ref 38–126)
Anion gap: 9 (ref 5–15)
BUN: 13 mg/dL (ref 8–23)
CO2: 26 mmol/L (ref 22–32)
Calcium: 9.1 mg/dL (ref 8.9–10.3)
Chloride: 105 mmol/L (ref 98–111)
Creatinine, Ser: 0.74 mg/dL (ref 0.44–1.00)
GFR, Estimated: >60 mL/min (ref >60)
Glucose, Bld: 155 mg/dL — ABNORMAL HIGH (ref 70–99)
Potassium: 3.8 mmol/L (ref 3.5–5.1)
Sodium: 140 mmol/L (ref 135–145)
Total Bilirubin: 1 mg/dL (ref 0.3–1.2)
Total Protein: 7.1 g/dL (ref 6.5–8.1)

## 2023-01-12 LAB — TYPE AND SCREEN
ABO/RH(D): O POS
Antibody Screen: NEGATIVE

## 2023-01-12 LAB — CBC WITH DIFFERENTIAL/PLATELET
Abs Immature Granulocytes: 0.01 10*3/uL (ref 0.00–0.07)
Basophils Absolute: 0.1 10*3/uL (ref 0.0–0.1)
Basophils Relative: 1 %
Eosinophils Absolute: 0.2 10*3/uL (ref 0.0–0.5)
Eosinophils Relative: 3 %
HCT: 49.6 % — ABNORMAL HIGH (ref 36.0–46.0)
Hemoglobin: 15.2 g/dL — ABNORMAL HIGH (ref 12.0–15.0)
Immature Granulocytes: 0 %
Lymphocytes Relative: 23 %
Lymphs Abs: 1.3 10*3/uL (ref 0.7–4.0)
MCH: 28.5 pg (ref 26.0–34.0)
MCHC: 30.6 g/dL (ref 30.0–36.0)
MCV: 93.1 fL (ref 80.0–100.0)
Monocytes Absolute: 0.7 10*3/uL (ref 0.1–1.0)
Monocytes Relative: 12 %
Neutro Abs: 3.4 10*3/uL (ref 1.7–7.7)
Neutrophils Relative %: 61 %
Platelets: 195 10*3/uL (ref 150–400)
RBC: 5.33 MIL/uL — ABNORMAL HIGH (ref 3.87–5.11)
RDW: 14.8 % (ref 11.5–15.5)
WBC: 5.6 10*3/uL (ref 4.0–10.5)
nRBC: 0 % (ref 0.0–0.2)

## 2023-01-12 LAB — SURGICAL PCR SCREEN
MRSA, PCR: NEGATIVE
Staphylococcus aureus: NEGATIVE

## 2023-01-12 LAB — GLUCOSE, CAPILLARY: Glucose-Capillary: 151 mg/dL — ABNORMAL HIGH (ref 70–99)

## 2023-01-19 NOTE — Care Plan (Signed)
Ortho Bundle Case Management Note  Patient Details  Name: [name] MRN: [id number] Date of Birth: Mar 02, 1941    spoke with patient. states that she will discharge to home. son lives next door. she is requesting HHPT until she is able to drive. she will not have her son driving her because he works. she has a RW at home. HHPT referral to [facility] and OPPT referral to [facility]. Choice offered    DME Arranged:    DME Agency:     HH Arranged:  PT HH Agency:  [facility]  Additional Comments: Please contact me with any questions of if this plan should need to change.  Ladell Heads,  [facility]  [phone] 01/19/2023, 2:06 PM

## 2023-01-24 ENCOUNTER — Observation Stay (HOSPITAL_COMMUNITY): Payer: PPO

## 2023-01-24 ENCOUNTER — Ambulatory Visit (HOSPITAL_BASED_OUTPATIENT_CLINIC_OR_DEPARTMENT_OTHER): Payer: PPO | Admitting: Anesthesiology

## 2023-01-24 ENCOUNTER — Other Ambulatory Visit: Payer: Self-pay

## 2023-01-24 ENCOUNTER — Ambulatory Visit (HOSPITAL_COMMUNITY): Payer: PPO | Admitting: Physician Assistant

## 2023-01-24 ENCOUNTER — Observation Stay (HOSPITAL_COMMUNITY)
Admission: RE | Admit: 2023-01-24 | Discharge: 2023-01-25 | Disposition: A | Discharge status: Home Health | Payer: PPO | Source: Ambulatory Visit | Attending: Orthopedic Surgery | Admitting: Orthopedic Surgery

## 2023-01-24 ENCOUNTER — Encounter (HOSPITAL_COMMUNITY)
Admission: RE | Disposition: A | Discharge status: Home Health | Payer: Self-pay | Source: Ambulatory Visit | Attending: Orthopedic Surgery

## 2023-01-24 ENCOUNTER — Encounter (HOSPITAL_COMMUNITY): Payer: Self-pay | Admitting: Orthopedic Surgery

## 2023-01-24 DIAGNOSIS — M1711 Unilateral primary osteoarthritis, right knee: Secondary | ICD-10-CM

## 2023-01-24 DIAGNOSIS — Z7984 Long term (current) use of oral hypoglycemic drugs: Secondary | ICD-10-CM

## 2023-01-24 DIAGNOSIS — I10 Essential (primary) hypertension: Secondary | ICD-10-CM

## 2023-01-24 DIAGNOSIS — Z85038 Personal history of other malignant neoplasm of large intestine: Secondary | ICD-10-CM | POA: Insufficient documentation

## 2023-01-24 DIAGNOSIS — Z96642 Presence of left artificial hip joint: Secondary | ICD-10-CM | POA: Insufficient documentation

## 2023-01-24 DIAGNOSIS — E039 Hypothyroidism, unspecified: Secondary | ICD-10-CM | POA: Diagnosis not present

## 2023-01-24 DIAGNOSIS — Z96652 Presence of left artificial knee joint: Secondary | ICD-10-CM | POA: Diagnosis not present

## 2023-01-24 DIAGNOSIS — M21061 Valgus deformity, not elsewhere classified, right knee: Secondary | ICD-10-CM | POA: Insufficient documentation

## 2023-01-24 DIAGNOSIS — Z85828 Personal history of other malignant neoplasm of skin: Secondary | ICD-10-CM | POA: Diagnosis not present

## 2023-01-24 DIAGNOSIS — E119 Type 2 diabetes mellitus without complications: Secondary | ICD-10-CM | POA: Diagnosis not present

## 2023-01-24 DIAGNOSIS — Z471 Aftercare following joint replacement surgery: Secondary | ICD-10-CM | POA: Diagnosis not present

## 2023-01-24 DIAGNOSIS — Z87891 Personal history of nicotine dependence: Secondary | ICD-10-CM | POA: Insufficient documentation

## 2023-01-24 DIAGNOSIS — Z79899 Other long term (current) drug therapy: Secondary | ICD-10-CM | POA: Insufficient documentation

## 2023-01-24 DIAGNOSIS — G8918 Other acute postprocedural pain: Secondary | ICD-10-CM | POA: Diagnosis not present

## 2023-01-24 DIAGNOSIS — Z01818 Encounter for other preprocedural examination: Secondary | ICD-10-CM

## 2023-01-24 DIAGNOSIS — Z96651 Presence of right artificial knee joint: Secondary | ICD-10-CM | POA: Diagnosis not present

## 2023-01-24 HISTORY — PX: TOTAL KNEE ARTHROPLASTY: SHX125

## 2023-01-24 LAB — GLUCOSE, CAPILLARY
Glucose-Capillary: 133 mg/dL — ABNORMAL HIGH (ref 70–99)
Glucose-Capillary: 188 mg/dL — ABNORMAL HIGH (ref 70–99)
Glucose-Capillary: 312 mg/dL — ABNORMAL HIGH (ref 70–99)

## 2023-01-24 SURGERY — ARTHROPLASTY, KNEE, TOTAL
Anesthesia: General | Site: Knee | Laterality: Right

## 2023-01-24 MED ORDER — PANTOPRAZOLE SODIUM 40 MG PO TBEC
40.0000 mg | DELAYED_RELEASE_TABLET | Freq: Every day | ORAL | Status: DC
  Administered 2023-01-24 – 2023-01-25 (×2): 40 mg via ORAL
  Filled 2023-01-24 (×2): qty 1

## 2023-01-24 MED ORDER — INSULIN ASPART 100 UNIT/ML IJ SOLN
0.0000 [IU] | Freq: Three times a day (TID) | INTRAMUSCULAR | Status: DC
  Administered 2023-01-25: 3 [IU] via SUBCUTANEOUS
  Administered 2023-01-25: 2 [IU] via SUBCUTANEOUS

## 2023-01-24 MED ORDER — HYDROMORPHONE HCL 1 MG/ML IJ SOLN
0.2500 mg | INTRAMUSCULAR | Status: DC | PRN
  Administered 2023-01-24 (×2): 0.25 mg via INTRAVENOUS

## 2023-01-24 MED ORDER — DULAGLUTIDE 3 MG/0.5ML ~~LOC~~ SOAJ: 3.0000 mg | SUBCUTANEOUS | Status: DC

## 2023-01-24 MED ORDER — EPHEDRINE 5 MG/ML INJ
INTRAVENOUS | Status: AC
  Filled 2023-01-24: qty 5

## 2023-01-24 MED ORDER — ONDANSETRON HCL 4 MG PO TABS: 4.0000 mg | ORAL_TABLET | Freq: Four times a day (QID) | ORAL | Status: DC | PRN

## 2023-01-24 MED ORDER — ACETAMINOPHEN 10 MG/ML IV SOLN
INTRAVENOUS | Status: AC
  Filled 2023-01-24: qty 100

## 2023-01-24 MED ORDER — LACTATED RINGERS IV SOLN: INTRAVENOUS | Status: DC

## 2023-01-24 MED ORDER — POLYETHYLENE GLYCOL 3350 17 G PO PACK: 17.0000 g | PACK | Freq: Every day | ORAL | Status: DC | PRN

## 2023-01-24 MED ORDER — ACETAMINOPHEN 325 MG PO TABS: 325.0000 mg | ORAL_TABLET | Freq: Four times a day (QID) | ORAL | Status: DC | PRN

## 2023-01-24 MED ORDER — KETOROLAC TROMETHAMINE 15 MG/ML IJ SOLN
7.5000 mg | Freq: Four times a day (QID) | INTRAMUSCULAR | Status: AC
  Administered 2023-01-24 – 2023-01-25 (×4): 7.5 mg via INTRAVENOUS
  Filled 2023-01-24 (×4): qty 1

## 2023-01-24 MED ORDER — METHOCARBAMOL 500 MG IVPB - SIMPLE MED
500.0000 mg | Freq: Four times a day (QID) | INTRAVENOUS | Status: DC | PRN
  Administered 2023-01-24: 500 mg via INTRAVENOUS

## 2023-01-24 MED ORDER — ONDANSETRON HCL 4 MG/2ML IJ SOLN
INTRAMUSCULAR | Status: AC
  Filled 2023-01-24: qty 2

## 2023-01-24 MED ORDER — EPHEDRINE SULFATE-NACL 50-0.9 MG/10ML-% IV SOSY
PREFILLED_SYRINGE | INTRAVENOUS | Status: DC | PRN
  Administered 2023-01-24 (×3): 5 mg via INTRAVENOUS

## 2023-01-24 MED ORDER — ROSUVASTATIN CALCIUM 20 MG PO TABS
20.0000 mg | ORAL_TABLET | Freq: Every day | ORAL | Status: DC
  Administered 2023-01-25: 20 mg via ORAL
  Filled 2023-01-24: qty 2

## 2023-01-24 MED ORDER — METHOCARBAMOL 500 MG PO TABS: 500.0000 mg | ORAL_TABLET | Freq: Four times a day (QID) | ORAL | Status: DC | PRN

## 2023-01-24 MED ORDER — GLIMEPIRIDE 2 MG PO TABS
2.0000 mg | ORAL_TABLET | Freq: Every day | ORAL | Status: DC
  Administered 2023-01-25: 2 mg via ORAL
  Filled 2023-01-24: qty 1

## 2023-01-24 MED ORDER — PHENYLEPHRINE 80 MCG/ML (10ML) SYRINGE FOR IV PUSH (FOR BLOOD PRESSURE SUPPORT)
PREFILLED_SYRINGE | INTRAVENOUS | Status: AC
  Filled 2023-01-24: qty 10

## 2023-01-24 MED ORDER — ACETAMINOPHEN 10 MG/ML IV SOLN
1000.0000 mg | Freq: Once | INTRAVENOUS | Status: DC | PRN
  Administered 2023-01-24: 1000 mg via INTRAVENOUS

## 2023-01-24 MED ORDER — ASPIRIN 81 MG PO CHEW
81.0000 mg | CHEWABLE_TABLET | Freq: Two times a day (BID) | ORAL | Status: DC
  Administered 2023-01-24 – 2023-01-25 (×2): 81 mg via ORAL
  Filled 2023-01-24 (×2): qty 1

## 2023-01-24 MED ORDER — DEXAMETHASONE SODIUM PHOSPHATE 10 MG/ML IJ SOLN
INTRAMUSCULAR | Status: DC | PRN
  Administered 2023-01-24: 5 mg via INTRAVENOUS

## 2023-01-24 MED ORDER — CEFAZOLIN SODIUM-DEXTROSE 2-4 GM/100ML-% IV SOLN
2.0000 g | Freq: Four times a day (QID) | INTRAVENOUS | Status: AC
  Administered 2023-01-24 – 2023-01-25 (×2): 2 g via INTRAVENOUS
  Filled 2023-01-24 (×2): qty 100

## 2023-01-24 MED ORDER — FLUTICASONE PROPIONATE 50 MCG/ACT NA SUSP: 2.0000 | Freq: Every day | NASAL | Status: DC | PRN

## 2023-01-24 MED ORDER — SURGIRINSE WOUND IRRIGATION SYSTEM - OPTIME: TOPICAL | Status: DC | PRN

## 2023-01-24 MED ORDER — EMPAGLIFLOZIN 25 MG PO TABS
25.0000 mg | ORAL_TABLET | Freq: Every day | ORAL | Status: DC
  Administered 2023-01-25: 25 mg via ORAL
  Filled 2023-01-24: qty 1

## 2023-01-24 MED ORDER — LIDOCAINE 2% (20 MG/ML) 5 ML SYRINGE
INTRAMUSCULAR | Status: DC | PRN
  Administered 2023-01-24: 40 mg via INTRAVENOUS

## 2023-01-24 MED ORDER — ONDANSETRON HCL 4 MG/2ML IJ SOLN: 4.0000 mg | Freq: Four times a day (QID) | INTRAMUSCULAR | Status: DC | PRN

## 2023-01-24 MED ORDER — OXYCODONE HCL 5 MG PO TABS
5.0000 mg | ORAL_TABLET | ORAL | Status: DC | PRN
  Administered 2023-01-24: 5 mg via ORAL
  Administered 2023-01-25 (×3): 10 mg via ORAL
  Filled 2023-01-24 (×2): qty 2
  Filled 2023-01-24: qty 1
  Filled 2023-01-24: qty 2

## 2023-01-24 MED ORDER — AMISULPRIDE (ANTIEMETIC) 5 MG/2ML IV SOLN: 10.0000 mg | Freq: Once | INTRAVENOUS | Status: DC | PRN

## 2023-01-24 MED ORDER — PROMETHAZINE HCL 25 MG/ML IJ SOLN: 6.2500 mg | INTRAMUSCULAR | Status: DC | PRN

## 2023-01-24 MED ORDER — SODIUM CHLORIDE 0.9 % IR SOLN
Status: DC | PRN
  Administered 2023-01-24: 3000 mL

## 2023-01-24 MED ORDER — METHOCARBAMOL 500 MG IVPB - SIMPLE MED
INTRAVENOUS | Status: AC
  Filled 2023-01-24: qty 55

## 2023-01-24 MED ORDER — DEXAMETHASONE SODIUM PHOSPHATE 10 MG/ML IJ SOLN
INTRAMUSCULAR | Status: AC
  Filled 2023-01-24: qty 1

## 2023-01-24 MED ORDER — ISOPROPYL ALCOHOL 70 % SOLN
Status: DC | PRN
  Administered 2023-01-24: 1 via TOPICAL

## 2023-01-24 MED ORDER — MIDAZOLAM HCL 2 MG/2ML IJ SOLN
1.0000 mg | INTRAMUSCULAR | Status: DC
  Filled 2023-01-24: qty 2

## 2023-01-24 MED ORDER — CHLORHEXIDINE GLUCONATE 0.12 % MT SOLN
15.0000 mL | Freq: Once | OROMUCOSAL | Status: AC
  Administered 2023-01-24: 15 mL via OROMUCOSAL

## 2023-01-24 MED ORDER — ACETAMINOPHEN 325 MG PO TABS: 325.0000 mg | ORAL_TABLET | Freq: Once | ORAL | Status: DC | PRN

## 2023-01-24 MED ORDER — BUPIVACAINE LIPOSOME 1.3 % IJ SUSP: 20.0000 mL | Freq: Once | INTRAMUSCULAR | Status: DC

## 2023-01-24 MED ORDER — FENTANYL CITRATE PF 50 MCG/ML IJ SOSY
50.0000 ug | PREFILLED_SYRINGE | INTRAMUSCULAR | Status: AC
  Administered 2023-01-24: 25 ug via INTRAVENOUS
  Filled 2023-01-24: qty 2

## 2023-01-24 MED ORDER — SODIUM CHLORIDE 0.9% FLUSH
INTRAVENOUS | Status: DC | PRN
  Administered 2023-01-24: 60 mL

## 2023-01-24 MED ORDER — HYDROMORPHONE HCL 1 MG/ML IJ SOLN
INTRAMUSCULAR | Status: DC | PRN
  Administered 2023-01-24 (×2): .5 mg via INTRAVENOUS

## 2023-01-24 MED ORDER — PROPOFOL 10 MG/ML IV BOLUS
INTRAVENOUS | Status: DC | PRN
  Administered 2023-01-24: 130 mg via INTRAVENOUS
  Administered 2023-01-24: 40 mg via INTRAVENOUS

## 2023-01-24 MED ORDER — DOCUSATE SODIUM 100 MG PO CAPS
100.0000 mg | ORAL_CAPSULE | Freq: Two times a day (BID) | ORAL | Status: DC
  Administered 2023-01-24 – 2023-01-25 (×2): 100 mg via ORAL
  Filled 2023-01-24 (×2): qty 1

## 2023-01-24 MED ORDER — POVIDONE-IODINE 10 % EX SWAB
2.0000 | Freq: Once | CUTANEOUS | Status: AC
  Administered 2023-01-24: 2 via TOPICAL

## 2023-01-24 MED ORDER — ACETAMINOPHEN 160 MG/5ML PO SOLN: 325.0000 mg | Freq: Once | ORAL | Status: DC | PRN

## 2023-01-24 MED ORDER — HYDROMORPHONE HCL 1 MG/ML IJ SOLN
0.5000 mg | INTRAMUSCULAR | Status: DC | PRN
  Administered 2023-01-25: 0.5 mg via INTRAVENOUS
  Filled 2023-01-24: qty 1

## 2023-01-24 MED ORDER — 0.9 % SODIUM CHLORIDE (POUR BTL) OPTIME
TOPICAL | Status: DC | PRN
  Administered 2023-01-24: 1000 mL

## 2023-01-24 MED ORDER — LEVOTHYROXINE SODIUM 112 MCG PO TABS
112.0000 ug | ORAL_TABLET | Freq: Every day | ORAL | Status: DC
  Administered 2023-01-25: 112 ug via ORAL
  Filled 2023-01-24: qty 1

## 2023-01-24 MED ORDER — CITALOPRAM HYDROBROMIDE 20 MG PO TABS
10.0000 mg | ORAL_TABLET | Freq: Every day | ORAL | Status: DC
  Administered 2023-01-25: 10 mg via ORAL
  Filled 2023-01-24: qty 1

## 2023-01-24 MED ORDER — BUPIVACAINE LIPOSOME 1.3 % IJ SUSP
INTRAMUSCULAR | Status: DC | PRN
  Administered 2023-01-24: 20 mL

## 2023-01-24 MED ORDER — PHENYLEPHRINE 80 MCG/ML (10ML) SYRINGE FOR IV PUSH (FOR BLOOD PRESSURE SUPPORT)
PREFILLED_SYRINGE | INTRAVENOUS | Status: DC | PRN
  Administered 2023-01-24: 40 ug via INTRAVENOUS
  Administered 2023-01-24 (×2): 80 ug via INTRAVENOUS
  Administered 2023-01-24: 40 ug via INTRAVENOUS

## 2023-01-24 MED ORDER — ONDANSETRON HCL 4 MG/2ML IJ SOLN
INTRAMUSCULAR | Status: DC | PRN
  Administered 2023-01-24: 4 mg via INTRAVENOUS

## 2023-01-24 MED ORDER — HYDROMORPHONE HCL 2 MG/ML IJ SOLN
INTRAMUSCULAR | Status: AC
  Filled 2023-01-24: qty 1

## 2023-01-24 MED ORDER — HYDROMORPHONE HCL 1 MG/ML IJ SOLN
INTRAMUSCULAR | Status: AC
  Filled 2023-01-24: qty 1

## 2023-01-24 MED ORDER — FENTANYL CITRATE (PF) 100 MCG/2ML IJ SOLN
INTRAMUSCULAR | Status: AC
  Filled 2023-01-24: qty 2

## 2023-01-24 MED ORDER — FENTANYL CITRATE (PF) 100 MCG/2ML IJ SOLN
INTRAMUSCULAR | Status: DC | PRN
  Administered 2023-01-24 (×3): 25 ug via INTRAVENOUS
  Administered 2023-01-24: 50 ug via INTRAVENOUS
  Administered 2023-01-24 (×3): 25 ug via INTRAVENOUS
  Administered 2023-01-24: 50 ug via INTRAVENOUS
  Administered 2023-01-24 (×2): 25 ug via INTRAVENOUS

## 2023-01-24 MED ORDER — SODIUM CHLORIDE (PF) 0.9 % IJ SOLN
INTRAMUSCULAR | Status: AC
  Filled 2023-01-24: qty 10

## 2023-01-24 MED ORDER — DILTIAZEM HCL ER COATED BEADS 240 MG PO CP24
240.0000 mg | ORAL_CAPSULE | Freq: Every day | ORAL | Status: DC
  Administered 2023-01-25: 240 mg via ORAL
  Filled 2023-01-24: qty 1

## 2023-01-24 MED ORDER — SODIUM CHLORIDE 0.9 % IV SOLN: INTRAVENOUS | Status: DC

## 2023-01-24 MED ORDER — WATER FOR IRRIGATION, STERILE IR SOLN
Status: DC | PRN
  Administered 2023-01-24: 2000 mL

## 2023-01-24 MED ORDER — DIPHENHYDRAMINE HCL 12.5 MG/5ML PO ELIX: 12.5000 mg | ORAL_SOLUTION | ORAL | Status: DC | PRN

## 2023-01-24 MED ORDER — ACETAMINOPHEN 500 MG PO TABS
1000.0000 mg | ORAL_TABLET | Freq: Once | ORAL | Status: AC
  Administered 2023-01-24: 1000 mg via ORAL
  Filled 2023-01-24: qty 2

## 2023-01-24 MED ORDER — ACETAMINOPHEN 500 MG PO TABS
1000.0000 mg | ORAL_TABLET | Freq: Four times a day (QID) | ORAL | Status: DC
  Administered 2023-01-25 (×3): 1000 mg via ORAL
  Filled 2023-01-24 (×3): qty 2

## 2023-01-24 MED ORDER — MENTHOL 3 MG MT LOZG: 1.0000 | LOZENGE | OROMUCOSAL | Status: DC | PRN

## 2023-01-24 MED ORDER — SODIUM CHLORIDE (PF) 0.9 % IJ SOLN
INTRAMUSCULAR | Status: AC
  Filled 2023-01-24: qty 50

## 2023-01-24 MED ORDER — ORAL CARE MOUTH RINSE: 15.0000 mL | Freq: Once | OROMUCOSAL | Status: AC

## 2023-01-24 MED ORDER — PHENOL 1.4 % MT LIQD: 1.0000 | OROMUCOSAL | Status: DC | PRN

## 2023-01-24 MED ORDER — CEFAZOLIN SODIUM-DEXTROSE 2-4 GM/100ML-% IV SOLN
2.0000 g | INTRAVENOUS | Status: AC
  Administered 2023-01-24: 2 g via INTRAVENOUS
  Filled 2023-01-24: qty 100

## 2023-01-24 MED ORDER — BUPIVACAINE LIPOSOME 1.3 % IJ SUSP
INTRAMUSCULAR | Status: AC
  Filled 2023-01-24: qty 20

## 2023-01-24 MED ORDER — TRANEXAMIC ACID-NACL 1000-0.7 MG/100ML-% IV SOLN
1000.0000 mg | INTRAVENOUS | Status: AC
  Administered 2023-01-24: 1000 mg via INTRAVENOUS
  Filled 2023-01-24: qty 100

## 2023-01-24 SURGICAL SUPPLY — 71 items
ADH SKN CLS APL DERMABOND .7 (GAUZE/BANDAGES/DRESSINGS) ×1
APL PRP STRL LF DISP 70% ISPRP (MISCELLANEOUS) ×2
BAG COUNTER SPONGE SURGICOUNT (BAG) IMPLANT
BAG SPNG CNTER NS LX DISP (BAG)
BLADE SAG 18X100X1.27 (BLADE) ×1 IMPLANT
BLADE SAW SAG 35X64 .89 (BLADE) ×1 IMPLANT
BLADE SAW SGTL 11.0X1.19X90.0M (BLADE) IMPLANT
BNDG CMPR 5X3 CHSV STRCH STRL (GAUZE/BANDAGES/DRESSINGS) ×1
BNDG CMPR MED 10X6 ELC LF (GAUZE/BANDAGES/DRESSINGS) ×1
BNDG COHESIVE 3X5 TAN ST LF (GAUZE/BANDAGES/DRESSINGS) ×1 IMPLANT
BNDG ELASTIC 6X10 VLCR STRL LF (GAUZE/BANDAGES/DRESSINGS) ×1 IMPLANT
BOWL SMART MIX CTS (DISPOSABLE) ×1 IMPLANT
BSPLAT TIB 5D F CMNT STM RT (Knees) ×1 IMPLANT
CEMENT BONE R 1X40 (Cement) IMPLANT
CEMENT BONE REFOBACIN R1X40 US (Cement) IMPLANT
CHLORAPREP W/TINT 26 (MISCELLANEOUS) ×2 IMPLANT
COMP PATELLAR 35 STD 9 THK (Orthopedic Implant) IMPLANT
COVER SURGICAL LIGHT HANDLE (MISCELLANEOUS) ×1 IMPLANT
CUFF TOURN SGL QUICK 34 (TOURNIQUET CUFF) ×1
CUFF TRNQT CYL 34X4.125X (TOURNIQUET CUFF) ×1 IMPLANT
DERMABOND ADVANCED .7 DNX12 (GAUZE/BANDAGES/DRESSINGS) ×1 IMPLANT
DRAPE INCISE IOBAN 85X60 (DRAPES) ×1 IMPLANT
DRAPE SHEET LG 3/4 BI-LAMINATE (DRAPES) ×1 IMPLANT
DRAPE U-SHAPE 47X51 STRL (DRAPES) ×1 IMPLANT
DRESSING AQUACEL AG SP 3.5X10 (GAUZE/BANDAGES/DRESSINGS) ×1 IMPLANT
DRSG AQUACEL AG ADV 3.5X10 (GAUZE/BANDAGES/DRESSINGS) IMPLANT
DRSG AQUACEL AG SP 3.5X10 (GAUZE/BANDAGES/DRESSINGS) ×1
ELECT REM PT RETURN 15FT ADLT (MISCELLANEOUS) ×1 IMPLANT
FEMUR  CMT CCR STD SZ9 R KNEE (Knees) ×1 IMPLANT
FEMUR CMT CCR STD SZ9 R KNEE (Knees) ×1 IMPLANT
FEMUR CMTD CCR STD SZ9 R KNEE (Knees) IMPLANT
GAUZE SPONGE 4X4 12PLY STRL (GAUZE/BANDAGES/DRESSINGS) ×1 IMPLANT
GLOVE BIO SURGEON STRL SZ 6.5 (GLOVE) ×2 IMPLANT
GLOVE BIOGEL PI IND STRL 6.5 (GLOVE) ×1 IMPLANT
GLOVE BIOGEL PI IND STRL 8 (GLOVE) ×1 IMPLANT
GLOVE SURG ORTHO 8.0 STRL STRW (GLOVE) ×2 IMPLANT
GOWN STRL REUS W/ TWL XL LVL3 (GOWN DISPOSABLE) ×2 IMPLANT
GOWN STRL REUS W/TWL XL LVL3 (GOWN DISPOSABLE) ×2
HANDPIECE INTERPULSE COAX TIP (DISPOSABLE) ×1
HDLS TROCR DRIL PIN KNEE 75 (PIN) ×1
HOLDER FOLEY CATH W/STRAP (MISCELLANEOUS) ×1 IMPLANT
HOOD PEEL AWAY T7 (MISCELLANEOUS) ×3 IMPLANT
INSERT PSN CPS VE RIGHT 12M (Insert) IMPLANT
KIT TURNOVER KIT A (KITS) IMPLANT
MANIFOLD NEPTUNE II (INSTRUMENTS) ×1 IMPLANT
MARKER SKIN DUAL TIP RULER LAB (MISCELLANEOUS) ×1 IMPLANT
NS IRRIG 1000ML POUR BTL (IV SOLUTION) ×1 IMPLANT
PACK TOTAL KNEE CUSTOM (KITS) ×1 IMPLANT
PATELLA ZIMMER 35MM (Orthopedic Implant) ×1 IMPLANT
PIN DRILL HDLS TROCAR 75 4PK (PIN) IMPLANT
SCREW HEADED 33MM KNEE (MISCELLANEOUS) IMPLANT
SET HNDPC FAN SPRY TIP SCT (DISPOSABLE) ×1 IMPLANT
SOLUTION IRRIG SURGIPHOR (IV SOLUTION) IMPLANT
SPIKE FLUID TRANSFER (MISCELLANEOUS) ×1 IMPLANT
STEM TIB ST PERS 14+30 (Stem) IMPLANT
STEM TIBIA 5 DEG SZ F R KNEE (Knees) IMPLANT
STRIP CLOSURE SKIN 1/2X4 (GAUZE/BANDAGES/DRESSINGS) ×1 IMPLANT
SUT MNCRL AB 3-0 PS2 18 (SUTURE) ×1 IMPLANT
SUT STRATAFIX 0 PDS 27 VIOLET (SUTURE) ×1
SUT STRATAFIX PDO 1 14 VIOLET (SUTURE) ×1
SUT STRATFX PDO 1 14 VIOLET (SUTURE) ×1
SUT VIC AB 2-0 CT2 27 (SUTURE) ×2 IMPLANT
SUTURE STRATFX 0 PDS 27 VIOLET (SUTURE) ×1 IMPLANT
SUTURE STRATFX PDO 1 14 VIOLET (SUTURE) ×1 IMPLANT
SYR 50ML LL SCALE MARK (SYRINGE) ×1 IMPLANT
TAPE STRIPS DRAPE STRL (GAUZE/BANDAGES/DRESSINGS) IMPLANT
TIBIA STEM 5 DEG SZ F R KNEE (Knees) ×1 IMPLANT
TRAY FOLEY MTR SLVR 14FR STAT (SET/KITS/TRAYS/PACK) IMPLANT
TUBE SUCTION HIGH CAP CLEAR NV (SUCTIONS) ×1 IMPLANT
UNDERPAD 30X36 HEAVY ABSORB (UNDERPADS AND DIAPERS) ×1 IMPLANT
WRAP KNEE MAXI GEL POST OP (GAUZE/BANDAGES/DRESSINGS) IMPLANT

## 2023-01-24 NOTE — Plan of Care (Signed)
  Problem: Education: Goal: Knowledge of the prescribed therapeutic regimen will improve Outcome: Progressing   Problem: Activity: Goal: Range of joint motion will improve Outcome: Progressing   Problem: Pain Management: Goal: Pain level will decrease with appropriate interventions Outcome: Progressing   Problem: Safety: Goal: Ability to remain free from injury will improve Outcome: Progressing   

## 2023-01-24 NOTE — Interval H&P Note (Signed)
The patient has been re-examined, and the chart reviewed, and there have been no interval changes to the documented history and physical.    Plan for R TKA for R knee OA  The operative side was examined and the patient was confirmed to have sensation to DPN, SPN, TN intact, Motor EHL, ext, flex 5/5, and DP 2+, PT 2+, No significant edema.   The risks, benefits, and alternatives have been discussed at length with patient, and the patient is willing to proceed.  R knee marked. Consent has been signed.  

## 2023-01-24 NOTE — Transfer of Care (Signed)
Immediate Anesthesia Transfer of Care Note  Patient: [name]  Procedure(s) Performed: TOTAL KNEE ARTHROPLASTY (Right: Knee)  Patient Location: PACU  Anesthesia Type:General  Level of Consciousness: awake, alert , oriented, and patient cooperative  Airway & Oxygen Therapy: Patient Spontanous Breathing and Patient connected to face mask oxygen  Post-op Assessment: Report given to RN, Post -op Vital signs reviewed and stable, and Patient moving all extremities  Post vital signs: Reviewed and stable  Last Vitals:  Vitals Value Taken Time  BP 130/68 01/24/23 1619  Temp    Pulse 83 01/24/23 1629  Resp 19 01/24/23 1629  SpO2 91 % 01/24/23 1629  Vitals shown include unvalidated device data.  Last Pain:  Vitals:   01/24/23 1032  TempSrc:   PainSc: 0-No pain         Complications: No notable events documented.

## 2023-01-24 NOTE — Discharge Instructions (Signed)
INSTRUCTIONS AFTER JOINT REPLACEMENT  ° °Remove items at home which could result in a fall. This includes throw rugs or furniture in walking pathways °ICE to the affected joint every three hours while awake for 30 minutes at a time, for at least the first 3-5 days, and then as needed for pain and swelling.  Continue to use ice for pain and swelling. You may notice swelling that will progress down to the foot and ankle.  This is normal after surgery.  Elevate your leg when you are not up walking on it.   °Continue to use the breathing machine you got in the hospital (incentive spirometer) which will help keep your temperature down.  It is common for your temperature to cycle up and down following surgery, especially at night when you are not up moving around and exerting yourself.  The breathing machine keeps your lungs expanded and your temperature down. ° ° °DIET:  As you were doing prior to hospitalization, we recommend a well-balanced diet. ° °DRESSING / WOUND CARE / SHOWERING ° °Keep the surgical dressing until follow up.  The dressing is water proof, so you can shower without any extra covering.  IF THE DRESSING FALLS OFF or the wound gets wet inside, change the dressing with sterile gauze.  Please use good hand washing techniques before changing the dressing.  Do not use any lotions or creams on the incision until instructed by your surgeon.   ° °ACTIVITY ° °Increase activity slowly as tolerated, but follow the weight bearing instructions below.   °No driving for 6 weeks or until further direction given by your physician.  You cannot drive while taking narcotics.  °No lifting or carrying greater than 10 lbs. until further directed by your surgeon. °Avoid periods of inactivity such as sitting longer than an hour when not asleep. This helps prevent blood clots.  °You may return to work once you are authorized by your doctor.  ° ° ° °WEIGHT BEARING  ° °Weight bearing as tolerated with assist device (walker, cane,  etc) as directed, use it as long as suggested by your surgeon or therapist, typically at least 4-6 weeks. ° ° °EXERCISES ° °Results after joint replacement surgery are often greatly improved when you follow the exercise, range of motion and muscle strengthening exercises prescribed by your doctor. Safety measures are also important to protect the joint from further injury. Any time any of these exercises cause you to have increased pain or swelling, decrease what you are doing until you are comfortable again and then slowly increase them. If you have problems or questions, call your caregiver or physical therapist for advice.  ° °Rehabilitation is important following a joint replacement. After just a few days of immobilization, the muscles of the leg can become weakened and shrink (atrophy).  These exercises are designed to build up the tone and strength of the thigh and leg muscles and to improve motion. Often times heat used for twenty to thirty minutes before working out will loosen up your tissues and help with improving the range of motion but do not use heat for the first two weeks following surgery (sometimes heat can increase post-operative swelling).  ° °These exercises can be done on a training (exercise) mat, on the floor, on a table or on a bed. Use whatever works the best and is most comfortable for you.    Use music or television while you are exercising so that the exercises are a pleasant break in your   day. This will make your life better with the exercises acting as a break in your routine that you can look forward to.   Perform all exercises about fifteen times, three times per day or as directed.  You should exercise both the operative leg and the other leg as well. ° °Exercises include: °  °Quad Sets - Tighten up the muscle on the front of the thigh (Quad) and hold for 5-10 seconds.   °Straight Leg Raises - With your knee straight (if you were given a brace, keep it on), lift the leg to 60  degrees, hold for 3 seconds, and slowly lower the leg.  Perform this exercise against resistance later as your leg gets stronger.  °Leg Slides: Lying on your back, slowly slide your foot toward your buttocks, bending your knee up off the floor (only go as far as is comfortable). Then slowly slide your foot back down until your leg is flat on the floor again.  °Angel Wings: Lying on your back spread your legs to the side as far apart as you can without causing discomfort.  °Hamstring Strength:  Lying on your back, push your heel against the floor with your leg straight by tightening up the muscles of your buttocks.  Repeat, but this time bend your knee to a comfortable angle, and push your heel against the floor.  You may put a pillow under the heel to make it more comfortable if necessary.  ° °A rehabilitation program following joint replacement surgery can speed recovery and prevent re-injury in the future due to weakened muscles. Contact your doctor or a physical therapist for more information on knee rehabilitation.  ° ° °CONSTIPATION ° °Constipation is defined medically as fewer than three stools per week and severe constipation as less than one stool per week.  Even if you have a regular bowel pattern at home, your normal regimen is likely to be disrupted due to multiple reasons following surgery.  Combination of anesthesia, postoperative narcotics, change in appetite and fluid intake all can affect your bowels.  ° °YOU MUST use at least one of the following options; they are listed in order of increasing strength to get the job done.  They are all available over the counter, and you may need to use some, POSSIBLY even all of these options:   ° °Drink plenty of fluids (prune juice may be helpful) and high fiber foods °Colace 100 mg by mouth twice a day  °Senokot for constipation as directed and as needed Dulcolax (bisacodyl), take with full glass of water  °Miralax (polyethylene glycol) once or twice a day as  needed. ° °If you have tried all these things and are unable to have a bowel movement in the first 3-4 days after surgery call either your surgeon or your primary doctor.   ° °If you experience loose stools or diarrhea, hold the medications until you stool forms back up.  If your symptoms do not get better within 1 week or if they get worse, check with your doctor.  If you experience "the worst abdominal pain ever" or develop nausea or vomiting, please contact the office immediately for further recommendations for treatment. ° ° °ITCHING:  If you experience itching with your medications, try taking only a single pain pill, or even half a pain pill at a time.  You can also use Benadryl over the counter for itching or also to help with sleep.  ° °TED HOSE STOCKINGS:  Use stockings on both   legs until for at least 2 weeks or as directed by physician office. They may be removed at night for sleeping. ° °MEDICATIONS:  See your medication summary on the “After Visit Summary” that nursing will review with you.  You may have some home medications which will be placed on hold until you complete the course of blood thinner medication.  It is important for you to complete the blood thinner medication as prescribed. ° ° °Blood clot prevention (DVT Prophylaxis): After surgery you are at an increased risk for a blood clot. you were prescribed a blood thinner, Aspirin 81mg, to be taken twice daily for a total of 4 weeks from surgery to help reduce your risk of getting a blood clot. This will help prevent a blood clot. Signs of a pulmonary embolus (blood clot in the lungs) include sudden short of breath, feeling lightheaded or dizzy, chest pain with a deep breath, rapid pulse rapid breathing. Signs of a blood clot in your arms or legs include new unexplained swelling and cramping, warm, red or darkened skin around the painful area. Please call the office or 911 right away if these signs or symptoms develop. ° °PRECAUTIONS:  If you  experience chest pain or shortness of breath - call 911 immediately for transfer to the hospital emergency department.  ° °If you develop a fever greater that 101 F, purulent drainage from wound, increased redness or drainage from wound, foul odor from the wound/dressing, or calf pain - CONTACT YOUR SURGEON.   °                                                °FOLLOW-UP APPOINTMENTS:  If you do not already have a post-op appointment, please call the office for an appointment to be seen by your surgeon.  Guidelines for how soon to be seen are listed in your “After Visit Summary”, but are typically between 2-3 weeks after surgery. ° °OTHER INSTRUCTIONS:  ° °Knee Replacement:  Do not place pillow under knee, focus on keeping the knee straight while resting. CPM instructions: 0-90 degrees, 2 hours in the morning, 2 hours in the afternoon, and 2 hours in the evening. Place foam block, curve side up under heel at all times except when in CPM or when walking.  DO NOT modify, tear, cut, or change the foam block in any way. ° °POST-OPERATIVE OPIOID TAPER INSTRUCTIONS: °It is important to wean off of your opioid medication as soon as possible. If you do not need pain medication after your surgery it is ok to stop day one. °Opioids include: °Codeine, Hydrocodone(Norco, Vicodin), Oxycodone(Percocet, oxycontin) and hydromorphone amongst others.  °Long term and even short term use of opiods can cause: °Increased pain response °Dependence °Constipation °Depression °Respiratory depression °And more.  °Withdrawal symptoms can include °Flu like symptoms °Nausea, vomiting °And more °Techniques to manage these symptoms °Hydrate well °Eat regular healthy meals °Stay active °Use relaxation techniques(deep breathing, meditating, yoga) °Do Not substitute Alcohol to help with tapering °If you have been on opioids for less than two weeks and do not have pain than it is ok to stop all together.  °Plan to wean off of opioids °This plan should  start within one week post op of your joint replacement. °Maintain the same interval or time between taking each dose and first decrease the dose.  °Cut the   total daily intake of opioids by one tablet each day °Next start to increase the time between doses. °The last dose that should be eliminated is the evening dose.  ° °MAKE SURE YOU:  °Understand these instructions.  °Get help right away if you are not doing well or get worse.  ° ° °Thank you for letting us be a part of your medical care team.  It is a privilege we respect greatly.  We hope these instructions will help you stay on track for a fast and full recovery!  ° ° ° °  °  °

## 2023-01-24 NOTE — Anesthesia Postprocedure Evaluation (Signed)
Anesthesia Post Note  Patient: [name]  Procedure(s) Performed: TOTAL KNEE ARTHROPLASTY (Right: Knee)     Patient location during evaluation: PACU Anesthesia Type: General Level of consciousness: awake and alert Pain management: pain level controlled Vital Signs Assessment: post-procedure vital signs reviewed and stable Respiratory status: spontaneous breathing, nonlabored ventilation, respiratory function stable and patient connected to nasal cannula oxygen Cardiovascular status: blood pressure returned to baseline and stable Postop Assessment: no apparent nausea or vomiting Anesthetic complications: no  No notable events documented.  Last Vitals:  Vitals:   01/24/23 1630 01/24/23 1645  BP: 114/69 137/68  Pulse: 77 77  Resp: 17 19  Temp:    SpO2: 96% 92%    Last Pain:  Vitals:   01/24/23 1619  TempSrc:   PainSc: [place] Ridhima Golberg

## 2023-01-24 NOTE — Plan of Care (Signed)
  Problem: Education: Goal: Knowledge of the prescribed therapeutic regimen will improve Outcome: Progressing   Problem: Activity: Goal: Ability to avoid complications of mobility impairment will improve Outcome: Progressing   Problem: Pain Management: Goal: Pain level will decrease with appropriate interventions Outcome: Progressing   Problem: Education: Goal: Ability to describe self-care measures that may prevent or decrease complications (Diabetes Survival Skills Education) will improve Outcome: Progressing   Problem: Nutritional: Goal: Maintenance of adequate nutrition will improve Outcome: Progressing   Problem: Education: Goal: Knowledge of General Education information will improve Description: Including pain rating scale, medication(s)/side effects and non-pharmacologic comfort measures Outcome: Progressing   Problem: Activity: Goal: Risk for activity intolerance will decrease Outcome: Progressing   Problem: Elimination: Goal: Will not experience complications related to bowel motility Outcome: Progressing   Problem: Pain Managment: Goal: General experience of comfort will improve Outcome: Progressing   Problem: Safety: Goal: Ability to remain free from injury will improve Outcome: Progressing

## 2023-01-24 NOTE — Anesthesia Procedure Notes (Signed)
Anesthesia Regional Block: Adductor canal block   Pre-Anesthetic Checklist: , timeout performed,  Correct Patient, Correct Site, Correct Laterality,  Correct Procedure, Correct Position, site marked,  Risks and benefits discussed,  Surgical consent,  Pre-op evaluation,  At surgeon's request and post-op pain management  Laterality: Right  Prep: chloraprep       Needles:  Injection technique: Single-shot  Needle Type: Echogenic Stimulator Needle     Needle Length: 9cm  Needle Gauge: 21     Additional Needles:   Procedures:,,,, ultrasound used (permanent image in chart),,    Narrative:  Start time: 01/24/2023 12:15 PM End time: 01/24/2023 12:20 PM Injection made incrementally with aspirations every 5 mL.  Performed by: Personally  Anesthesiologist: Effie Berkshire, MD  Additional Notes: Discussed risks and benefits of the nerve block in detail, including but not limited vascular injury, permanent nerve damage and infection.   Patient tolerated the procedure well. Local anesthetic introduced in an incremental fashion under minimal resistance after negative aspirations. No paresthesias were elicited. After completion of the procedure, no acute issues were identified and patient continued to be monitored by RN.

## 2023-01-24 NOTE — Anesthesia Procedure Notes (Signed)
Procedure Name: LMA Insertion Date/Time: 01/24/2023 1:22 PM  Performed by: Gwyndolyn Saxon, CRNAPre-anesthesia Checklist: Patient identified, Emergency Drugs available, Suction available and Patient being monitored Patient Re-evaluated:Patient Re-evaluated prior to induction Oxygen Delivery Method: Circle system utilized Preoxygenation: Pre-oxygenation with 100% oxygen Induction Type: IV induction Ventilation: Mask ventilation without difficulty LMA: LMA inserted LMA Size: 4.0 Number of attempts: 1 Placement Confirmation: positive ETCO2 and breath sounds checked- equal and bilateral Tube secured with: Tape Dental Injury: Teeth and Oropharynx as per pre-operative assessment

## 2023-01-24 NOTE — Interval H&P Note (Signed)

## 2023-01-24 NOTE — Progress Notes (Signed)
Orthopedic Tech Progress Note Patient Details:  [name] 1941-03-27 [id number]  Ortho Devices Type of Ortho Device: Bone foam zero knee Ortho Device/Splint Interventions: Ordered      [place] 01/24/2023, 4:31 PM

## 2023-01-24 NOTE — Op Note (Signed)
DATE OF SURGERY:  01/24/2023 TIME: 3:24 PM  PATIENT NAME:  [name]   AGE: 82 y.o.    PRE-OPERATIVE DIAGNOSIS: End-stage right knee osteoarthritis, severe valgus deformity  POST-OPERATIVE DIAGNOSIS:  Same  PROCEDURE: Right total Knee Arthroplasty  SURGEON:  Jefferey Lippmann A Neiman Roots, MD   ASSISTANT: Newt Minion, RNFA, present and scrubbed throughout the case, critical for assistance with exposure, retraction, instrumentation, and closure.   OPERATIVE IMPLANTS:  Cemented Zimmer persona size 9 standard PS femur, F tibial baseplate right, 14 x 30 mm tibial stem extension, 35 mm patella, 12 mm CPS insert Implant Name Type Inv. Item Serial No. Manufacturer Lot No. LRB No. Used Action  CEMENT BONE REFOBACIN [id number] [place] - [id number] Cement CEMENT BONE REFOBACIN [id number] [place]  ZIMMER RECON(ORTH,TRAU,BIO,SG) [id number] Right 2 Implanted  PATELLA ZIMMER 35MM - [id number] Orthopedic Implant PATELLA ZIMMER 35MM  ZIMMER RECON(ORTH,TRAU,BIO,SG) [id number] Right 1 Implanted  TIBIA STEM 5 DEG SZ F R KNEE - [id number] Knees TIBIA STEM 5 DEG SZ F R KNEE  ZIMMER RECON(ORTH,TRAU,BIO,SG) [id number] Right 1 Implanted  FEMUR  CMT CCR STD SZ9 R KNEE - [id number] Knees FEMUR  CMT CCR STD SZ9 R KNEE  ZIMMER RECON(ORTH,TRAU,BIO,SG) [id number] Right 1 Implanted  STEM TIB ST PERS 14+30 - [id number] Stem STEM TIB ST PERS 14+30  ZIMMER RECON(ORTH,TRAU,BIO,SG) [id number] Right 1 Implanted      PREOPERATIVE INDICATIONS:  [name] is a 82 y.o. year old female with end stage bone on bone degenerative arthritis of the knee who failed conservative treatment, including injections, antiinflammatories, activity modification, and assistive devices, and had significant impairment of their activities of daily living, and elected for Total Knee Arthroplasty.   The risks, benefits, and alternatives were discussed at length including but not limited to the risks of infection, bleeding, nerve injury, stiffness, blood clots, the need  for revision surgery, cardiopulmonary complications, among others, and they were willing to proceed.  OPERATIVE FINDINGS AND UNIQUE ASPECTS OF THE CASE:  severe valgus deformity   ESTIMATED BLOOD LOSS: 50cc  OPERATIVE DESCRIPTION:   Once adequate anesthesia was induced, preoperative antibiotics, 2 gm of ancef,1 gm of Tranexamic Acid, and 8 mg of Decadron administered, the patient was positioned supine with a right thigh tourniquet placed.  The right lower extremity was prepped and draped in sterile fashion.  A time-  out was performed identifying the patient, planned procedure, and the appropriate extremity.     The leg was  exsanguinated, tourniquet elevated to 250 mmHg.  A midline incision was  made followed by median parapatellar arthrotomy. Anterior horn of the medial meniscus was released and resected. A medial release was performed, the infrapatellar fat pad was resected with care taken to protect the patellar tendon. The suprapatellar fat was removed to exposed the distal anterior femur. The anterior horn of the lateral meniscus and ACL were released.    Following initial  exposure, I first started with the femur  The femoral  canal was opened with a drill, canal was suctioned to try to prevent fat emboli.  An  intramedullary rod was passed set at 7 degrees valgus, 10 mm. The distal femur was resected.  Following this resection, the tibia was  subluxated anteriorly.  Using the extramedullary guide, 2 mm of bone was resected off   the proximal lateral tibial defect.  The gap was tight in extension but seemed appropriate in flexion so additional 71mm was resected off the distal femur. We confirmed the gap would be  stable medially  and laterally with a size 30mm spacer block as well as confirmed that the tibial cut was perpendicular in the coronal plane, checking with an alignment rod.    Once this was done, the posterior femoral referencing femoral sizer was placed under to the posterior condyles  with 5 degrees of external rotational which was parallel to the transepicondylar axis and perpendicular to [employer]. The femur was sized to be a size 9 in the anterior-  posterior dimension. The  anterior, posterior, and  chamfer cuts were made without difficulty nor   notching making certain that I was along the anterior cortex to help  with flexion gap stability. Next a laminar spreader was placed with the knee in flexion and the medial lateral menisci were resected.  5 cc of the Exparel mixture was injected in the medial side of the back of the knee and 3 cc in the lateral side.  1/2 inch curved osteotome was used to resect posterior osteophyte that was then removed with a pituitary rongeur.       At this point, the tibia was sized to be a size F.  The size F tray was  then pinned in position. Trial reduction was now carried with a 9 femur, F tibia, a 10 mm PS insert.  The knee had full extension and was stable to varus valgus stress in extension.   Attention was next directed to the patella.  Precut  measurement was noted to be 21 mm.  I resected down to 13 mm and used a  71mm patellar button to restore patellar height as well as cover the cut surface.     The patella lug holes were drilled and a 75mm patella poly trial was placed.    The knee was brought to full extension with good flexion stability with the patella tracking through the trochlea without application of pressure.     Next the femoral component was again assessed and determined to be seated and appropriately lateralized.  The femoral lug holes were drilled.  The femoral component was then removed. Tibial component was again assessed and felt to be seated and appropriately rotated with the medial third of the tubercle. The tibia was then drilled, and keel punched.     Final components were  opened and antibiotic cement was mixed.      Final implants were then  cemented onto cleaned and dried cut surfaces of bone with the  knee brought to extension with a 12 mm PS poly.  The knee was irrigated with sterile Betadine diluted in saline as well as pulse lavage normal saline.  The synovial lining was  then injected a dilute Exparel.      Once the cement had fully cured, excess cement was removed throughout the knee.  I confirmed that I was satisfied with the range of motion and stability, and the final 12 mm CPS poly insert was chosen given valgus laxity.  It was placed into the knee.         The tourniquet had been let down.  No significant hemostasis was required.  The medial parapatellar arthrotomy was then reapproximated using #1 Stratafix sutures with the knee  in flexion.  The remaining wound was closed with 0 stratafix, 2-0 Vicryl, and running 3-0 Monocryl. The knee was cleaned, dried, dressed sterilely using Dermabond and   Aquacel dressing.  The patient was then brought to recovery room in stable condition, tolerating the procedure  well. There were no complications.  Post op recs: WB: WBAT Abx: ancef Imaging: PACU xrays DVT prophylaxis: Aspirin 81mg  BID x4 weeks Follow up: 2 weeks after surgery for a wound check with Dr. Zachery Dakins at [facility].  Address: [address], [place], [place] [address]  Office Phone: [phone]  Charlies Constable, MD Orthopaedic Surgery

## 2023-01-24 NOTE — Anesthesia Preprocedure Evaluation (Addendum)
Anesthesia Evaluation  Patient identified by MRN, date of birth, ID band Patient awake    Reviewed: Allergy & Precautions, NPO status , Patient's Chart, lab work & pertinent test results  Airway Mallampati: II  TM Distance: >3 FB Neck ROM: Full    Dental  (+) Teeth Intact, Dental Advisory Given, Partial Lower, Caps   Pulmonary former smoker   breath sounds clear to auscultation       Cardiovascular hypertension, + dysrhythmias + Valvular Problems/Murmurs  Rhythm:Regular Rate:Normal     Neuro/Psych negative neurological ROS  negative psych ROS   GI/Hepatic Neg liver ROS, PUD,,,  Endo/Other  diabetes, Type 2, Oral Hypoglycemic AgentsHypothyroidism    Renal/GU negative Renal ROS     Musculoskeletal  (+) Arthritis ,    Abdominal   Peds  Hematology negative hematology ROS (+)   Anesthesia Other Findings - Lumbar Fusion  Reproductive/Obstetrics                             Anesthesia Physical Anesthesia Plan  ASA: 3  Anesthesia Plan: General   Post-op Pain Management: Regional block*   Induction: Intravenous  PONV Risk Score and Plan: 3 and Ondansetron, Propofol infusion and Treatment may vary due to age or medical condition  Airway Management Planned: LMA  Additional Equipment: None  Intra-op Plan:   Post-operative Plan:   Informed Consent: I have reviewed the patients History and Physical, chart, labs and discussed the procedure including the risks, benefits and alternatives for the proposed anesthesia with the patient or authorized representative who has indicated his/her understanding and acceptance.     Dental advisory given  Plan Discussed with: CRNA  Anesthesia Plan Comments: (Lab Results      Component                Value               Date                      WBC                      5.6                 01/12/2023                HGB                      15.2 (H)             01/12/2023                HCT                      49.6 (H)            01/12/2023                MCV                      93.1                01/12/2023                PLT                      195  01/12/2023           )       Anesthesia Quick Evaluation

## 2023-01-25 DIAGNOSIS — M1711 Unilateral primary osteoarthritis, right knee: Secondary | ICD-10-CM | POA: Diagnosis not present

## 2023-01-25 LAB — BASIC METABOLIC PANEL
Anion gap: 8 (ref 5–15)
BUN: 15 mg/dL (ref 8–23)
CO2: 26 mmol/L (ref 22–32)
Calcium: 8.9 mg/dL (ref 8.9–10.3)
Chloride: 103 mmol/L (ref 98–111)
Creatinine, Ser: 0.61 mg/dL (ref 0.44–1.00)
GFR, Estimated: >60 mL/min (ref >60)
Glucose, Bld: 255 mg/dL — ABNORMAL HIGH (ref 70–99)
Potassium: 3.7 mmol/L (ref 3.5–5.1)
Sodium: 137 mmol/L (ref 135–145)

## 2023-01-25 LAB — CBC
HCT: 41.9 % (ref 36.0–46.0)
Hemoglobin: 13.4 g/dL (ref 12.0–15.0)
MCH: 29.3 pg (ref 26.0–34.0)
MCHC: 32 g/dL (ref 30.0–36.0)
MCV: 91.5 fL (ref 80.0–100.0)
Platelets: 170 10*3/uL (ref 150–400)
RBC: 4.58 MIL/uL (ref 3.87–5.11)
RDW: 14.4 % (ref 11.5–15.5)
WBC: 9.8 10*3/uL (ref 4.0–10.5)
nRBC: 0 % (ref 0.0–0.2)

## 2023-01-25 LAB — HEMOGLOBIN A1C
Hgb A1c MFr Bld: 6.9 % — ABNORMAL HIGH (ref 4.8–5.6)
Mean Plasma Glucose: 151 mg/dL

## 2023-01-25 LAB — GLUCOSE, CAPILLARY
Glucose-Capillary: 197 mg/dL — ABNORMAL HIGH (ref 70–99)
Glucose-Capillary: 201 mg/dL — ABNORMAL HIGH (ref 70–99)

## 2023-01-25 MED ORDER — OXYCODONE HCL 5 MG PO TABS: 5.0000 mg | ORAL_TABLET | ORAL | 0 refills | Status: AC | PRN

## 2023-01-25 MED ORDER — ASPIRIN 81 MG PO TBEC: 81.0000 mg | DELAYED_RELEASE_TABLET | Freq: Two times a day (BID) | ORAL | 0 refills | Status: AC

## 2023-01-25 MED ORDER — ONDANSETRON HCL 4 MG PO TABS: 4.0000 mg | ORAL_TABLET | Freq: Three times a day (TID) | ORAL | 0 refills | Status: AC | PRN

## 2023-01-25 MED ORDER — ACETAMINOPHEN 500 MG PO TABS: 1000.0000 mg | ORAL_TABLET | Freq: Three times a day (TID) | ORAL | 0 refills | Status: AC | PRN

## 2023-01-25 NOTE — Progress Notes (Signed)
Physical Therapy Treatment Patient Details Name: [name] MRN: [id number] DOB: [birth year] Today's Date: 01/25/2023   History of Present Illness Pt is 82 yo female admitted 01/24/23 for R TKA.  Pt with hx including but not limited to arthritis, HTN, DM2, HLD, L THA, spinal cord stimulator, lumbar fusion, L TKA 11/11/2021 (pt reports needs revision)    PT Comments    Pt is POD # 1 and is progressing well. She is transferring at supervision level and ambulated 100' with supervision and RW.  Pt has excellent quad activation, ROM, and pain control.  Pt performed stairs with 1 rail to simulate home environment - educated to always have assist with stairs.  Pt has limited home support ; however, that is not changing and she is mobilizing safely with RW, so from PT perspective pt demonstrates safe gait & transfers in order to return home from once discharged by MD.  While in hospital, will continue to benefit from PT for skilled therapy to advance mobility and exercises.      Recommendations for follow up therapy are one component of a multi-disciplinary discharge planning process, led by the attending physician.  Recommendations may be updated based on patient status, additional functional criteria and insurance authorization.   Assistance Recommended at Discharge Intermittent Supervision/Assistance  Patient can return home with the following A little help with walking and/or transfers;A little help with bathing/dressing/bathroom;Assistance with cooking/housework;Help with stairs or ramp for entrance   Equipment Recommendations  None recommended by PT    Recommendations for Other Services       Precautions / Restrictions Precautions Precautions: Fall;Knee Restrictions Weight Bearing Restrictions: Yes RLE Weight Bearing: Weight bearing as tolerated     Mobility  Bed Mobility Overal bed mobility: Needs Assistance Bed Mobility: Supine to Sit     Supine to sit: Supervision      General bed mobility comments: IN chair    Transfers Overall transfer level: Needs assistance Equipment used: Rolling walker (2 wheels) Transfers: Sit to/from Stand Sit to Stand: Supervision           General transfer comment: Performed x 2 safely with supervision    Ambulation/Gait Ambulation/Gait assistance: Supervision Gait Distance (Feet): 100 Feet Assistive device: Rolling walker (2 wheels) Gait Pattern/deviations: Step-through pattern Gait velocity: decreased     General Gait Details: Good carry over of RW proximity; steady gait with only slight decrease in weight shift to R   Stairs Stairs: Yes Stairs assistance: Min guard Stair Management: One rail Left, Forwards, With cane, Step to pattern Number of Stairs: 5 General stair comments: Educated on sequencing; performed stairs wtih 1 rail and cane to simulate home environment; educated to always have assist with stairs   Wheelchair Mobility    Modified Rankin (Stroke Patients Only)       Balance Overall balance assessment: Needs assistance Sitting-balance support: No upper extremity supported Sitting balance-Leahy Scale: Good     Standing balance support: Bilateral upper extremity supported, No upper extremity supported Standing balance-Leahy Scale: Fair Standing balance comment: RW to ambulate but able to do ADLs in standing wtihout assist.                            Cognition Arousal/Alertness: Awake/alert Behavior During Therapy: WFL for tasks assessed/performed Overall Cognitive Status: Within Functional Limits for tasks assessed  Exercises Total Joint Exercises Ankle Circles/Pumps: AROM, Both, 10 reps, Supine Quad Sets: AROM, Both, 10 reps, Supine Heel Slides: AROM, Right, 10 reps, Seated (Educated on gradual ROM to prevent too much pain and use of gait belt as needed) Hip ABduction/ADduction: AROM, Right, 10 reps,  Seated Long Arc Quad: AROM, Both, 10 reps, Seated Knee Flexion: AROM, Both, 10 reps, Seated Goniometric ROM: R knee 5 to 90 degrees    General Comments Educated on safe ice use, no pivots, car transfers, resting with leg straight, and TED hose during day. Also, encouraged walking every 1-2 hours during day. Educated on HEP with focus on mobility the first weeks. Discussed doing exercises within pain control and if pain increasing could decreased ROM, reps, and stop exercises as needed. Encouraged to perform quad sets and ankle pumps frequently for blood flow and to promote full knee extension.     Pertinent Vitals/Pain Pain Assessment Pain Assessment: No/denies pain    Home Living Family/patient expects to be discharged to:: Private residence Living Arrangements: Alone Available Help at Discharge: Family;Available PRN/intermittently (Son lives next door but he and his wife work. They both work full time jobs with long hours (>40 hrs/week). Reports sister in law very availble to come if called but will not stay) Type of Home: House Home Access: Stairs to enter Entrance Stairs-Rails: Right;Left Entrance Stairs-Number of Steps: 3   Home Layout: One level Home Equipment: Conservation officer, nature (2 wheels);Cane - single point;Shower seat;BSC/3in1      Prior Function            PT Goals (current goals can now be found in the care plan section) Acute Rehab PT Goals Patient Stated Goal: return home; be independent PT Goal Formulation: With patient Time For Goal Achievement: 02/08/23 Potential to Achieve Goals: Good Progress towards PT goals: Progressing toward goals    Frequency    7X/week      PT Plan Current plan remains appropriate    Co-evaluation              AM-PAC PT "6 Clicks" Mobility   Outcome Measure  Help needed turning from your back to your side while in a flat bed without using bedrails?: A Little Help needed moving from lying on your back to sitting on the  side of a flat bed without using bedrails?: A Little Help needed moving to and from a bed to a chair (including a wheelchair)?: A Little Help needed standing up from a chair using your arms (e.g., wheelchair or bedside chair)?: A Little Help needed to walk in hospital room?: A Little Help needed climbing 3-5 steps with a railing? : A Little 6 Click Score: 18    End of Session Equipment Utilized During Treatment: Gait belt Activity Tolerance: Patient tolerated treatment well Patient left: with call bell/phone within reach;in chair;with chair alarm set Nurse Communication: Mobility status PT Visit Diagnosis: Other abnormalities of gait and mobility (R26.89);Muscle weakness (generalized) (M62.81)     Time: [id number] PT Time Calculation (min) (ACUTE ONLY): 29 min  Charges:  $Gait Training: 8-22 mins $Therapeutic Exercise: 8-22 mins                     Abran Richard, PT Acute Rehab [facility] [place] 01/25/2023, 3:55 PM

## 2023-01-25 NOTE — TOC Transition Note (Signed)
Transition of Care [facility]) - CM/SW Discharge Note  Patient Details  Name: [name] MRN: [id number] Date of Birth: [birth year]  [facility] CM/SW Contact:  Sherie Don, LCSW Phone Number: 01/25/2023, 3:18 PM  Clinical Narrative: Patient is expected to discharge home after working with PT. CSW met with patient to review discharge plan and needs. Patient will go home with HHPT, which was prearranged with [place]. Patient has a rolling walker and BSC at home, so there are no DME needs. Patient is requesting custodial care through her HTA, but this was not set up prior to surgery. CSW attempted to secure custodial care through [place], [facility], [facility], and [facility] but none of the agencies will accept the referral or do not have coverage in patient's zip code. CSW spoke with Harland German, [place], and [place] with [facility] and the agency is willing to review the referral, but do not have any coverage in the area so the agency will contact their caregivers to see if one is willing to cover the patient's area. HTA prior authorization form completed and faxed to Mickel Crow [phone]) for [place] to review. Patient aware it is under review. TOC signing off.    Final next level of care: Home w Home Health Services Barriers to Discharge: No Barriers Identified  Patient Goals and CMS Choice CMS Medicare.gov Compare Post Acute Care list provided to:: Patient  Discharge Plan and Services Additional resources added to the After Visit Summary for         DME Arranged: N/A DME Agency: NA HH Arranged: PT HH Agency: Magazine features editor spoke with at [place] in orthopedist's office  Social Determinants of Health ([place]) Interventions [place]: No Food Insecurity (01/24/2023)  Housing: Low Risk  (01/24/2023)  Transportation Needs: No Transportation Needs (01/24/2023)  Utilities: Not At Risk (01/24/2023)   Tobacco Use: Medium Risk (01/24/2023)   Readmission Risk Interventions     No data to display

## 2023-01-25 NOTE — Progress Notes (Signed)
     Subjective:  Patient reports pain as mild.  Pleased with progress so far.  Did not yet worked with therapy yesterday given surgery was later in the afternoon.  Blood sugar higher overnight up to 300.  Diabetic counselor consult placed.  Possible DC home later today today if clears physical therapy.  Objective:   VITALS:   Vitals:   01/24/23 1816 01/24/23 2210 01/25/23 0240 01/25/23 0623  BP: 126/71 136/73 137/73 126/73  Pulse: 72 90 83 73  Resp: 18 15 15 15   Temp: 97.7 F (36.5 C) 97.6 F (36.4 C) 98.3 F (36.8 C) (!) 97.4 F (36.3 C)  TempSrc: Oral     SpO2: 97% 95% 95% 95%  Weight:      Height:        Neurovascular intact Sensation intact distally Intact pulses distally Dorsiflexion/Plantar flexion intact Incision: dressing C/D/I Compartment soft    Lab Results  Component Value Date   WBC 9.8 01/25/2023   HGB 13.4 01/25/2023   HCT 41.9 01/25/2023   MCV 91.5 01/25/2023   PLT 170 01/25/2023   BMET    Component Value Date/Time   NA 137 01/25/2023 0331   K 3.7 01/25/2023 0331   CL 103 01/25/2023 0331   CO2 26 01/25/2023 0331   GLUCOSE 255 (H) 01/25/2023 0331   BUN 15 01/25/2023 0331   CREATININE 0.61 01/25/2023 0331   CALCIUM 8.9 01/25/2023 0331   GFRNONAA >60 01/25/2023 0331      Xray: Total knee arthroplasty components good position no adverse features  Assessment/Plan: 1 Day Post-Op   Principal Problem:   Primary osteoarthritis of right knee  Status post right TKA 01/24/2023  Post op recs: WB: WBAT Abx: ancef Imaging: PACU xrays DVT prophylaxis: Aspirin 81mg  BID x4 weeks Follow up: 2 weeks after surgery for a wound check with Dr. Zachery Dakins at [facility].  Address: [address] [place], [place], [place] [address]  Office Phone: [phone]    Willaim Sheng 01/25/2023, 6:50 AM   Charlies Constable, MD  Contact information:   [phone] 7am-5pm epic message Dr. Zachery Dakins, or call office for patient follow up:  [phone] After hours and holidays please check [email or web address] for group call information for [facility]

## 2023-01-25 NOTE — Discharge Summary (Signed)
Physician Discharge Summary  Patient ID: [name] MRN: [id number] DOB/AGE: 1941/04/11 82 y.o.  Admit date: 01/24/2023 Discharge date: 01/25/2023  Admission Diagnoses:  Primary osteoarthritis of right knee  Discharge Diagnoses:  Principal Problem:   Primary osteoarthritis of right knee   Past Medical History:  Diagnosis Date   Arthritis    AVNRT (AV nodal re-entry tachycardia)    a. s/p catheter ablation on 09/30/14   Colon cancer    stage 1   Dysrhythmia    Heart murmur    Hyperlipidemia    Hypertension    Hypothyroidism    Shingles    Squamous cell carcinoma    Supraventricular tachycardia    Type 2 diabetes mellitus    Ulcerative colitis    intermittent mild flares    Surgeries: Procedure(s): TOTAL KNEE ARTHROPLASTY on 01/24/2023   Consultants (if any):   Discharged Condition: Improved  Hospital Course: [name] is an 82 y.o. female who was admitted 01/24/2023 with a diagnosis of Primary osteoarthritis of right knee and went to the operating room on 01/24/2023 and underwent the above named procedures.    She was given perioperative antibiotics:  Anti-infectives (From admission, onward)    Start     Dose/Rate Route Frequency Ordered Stop   01/24/23 1930  ceFAZolin (ANCEF) IVPB 2g/100 mL premix        2 g 200 mL/hr over 30 Minutes Intravenous Every 6 hours 01/24/23 1812 01/25/23 0155   01/24/23 1000  ceFAZolin (ANCEF) IVPB 2g/100 mL premix        2 g 200 mL/hr over 30 Minutes Intravenous On call to O.R. 01/24/23 [id number] 01/24/23 1352     .  She was given sequential compression devices, early ambulation, and aspirin for DVT prophylaxis.  She benefited maximally from the hospital stay and there were no complications.    Recent vital signs:  Vitals:   01/25/23 0926 01/25/23 1331  BP: 107/64 (!) 115/54  Pulse: 75 76  Resp: 16 14  Temp: 98.2 F (36.8 C) 97.7 F (36.5 C)  SpO2: 92% 93%    Recent laboratory studies:  Lab Results  Component  Value Date   HGB 13.4 01/25/2023   HGB 15.2 (H) 01/12/2023   HGB 15.9 (H) 11/11/2021   Lab Results  Component Value Date   WBC 9.8 01/25/2023   PLT 170 01/25/2023   Lab Results  Component Value Date   INR 1.0 07/01/2021   Lab Results  Component Value Date   NA 137 01/25/2023   K 3.7 01/25/2023   CL 103 01/25/2023   CO2 26 01/25/2023   BUN 15 01/25/2023   CREATININE 0.61 01/25/2023   GLUCOSE 255 (H) 01/25/2023    Discharge Medications:   Allergies as of 01/25/2023       Reactions   Cortisone Acetate [cortisone] Other (See Comments)   Hyperglycemia(adverse Reaction)   Metformin And Related Diarrhea   Misoprostol Diarrhea   Fosamax [alendronate Sodium] Nausea And Vomiting   Gabapentin Nausea And Vomiting   Hydrocodone-acetaminophen Nausea And Vomiting   NOT ALLERGIC TO TYLENOL! TOLERATED TYLENOL W/O COMPLICATION   Tape    Pulls skin off        Medication List     STOP taking these medications    bisacodyl 5 MG EC tablet Commonly known as: DULCOLAX   traMADol 50 MG tablet Commonly known as: ULTRAM       TAKE these medications    acetaminophen 500 MG tablet Commonly known  as: TYLENOL Take 2 tablets (1,000 mg total) by mouth every 8 (eight) hours as needed.   aspirin EC 81 MG tablet Take 1 tablet (81 mg total) by mouth 2 (two) times daily for 28 days. Swallow whole. Start taking on: January 26, 2023   CALCIUM-VITAMIN D PO Take 1 tablet by mouth in the morning and at bedtime.   citalopram 10 MG tablet Commonly known as: CELEXA Take 10 mg by mouth daily.   diltiazem 240 MG 24 hr capsule Commonly known as: CARDIZEM CD Take 240 mg by mouth daily.   Dulaglutide 3 MG/0.5ML Sopn Inject 3 mg into the skin every Saturday.   empagliflozin 25 MG Tabs tablet Commonly known as: JARDIANCE Take 25 mg by mouth daily.   fluticasone 50 MCG/ACT nasal spray Commonly known as: FLONASE Place 2 sprays into both nostrils daily as needed for allergies or rhinitis.    glimepiride 4 MG tablet Commonly known as: AMARYL Take 2 mg by mouth daily with breakfast.   levothyroxine 112 MCG tablet Commonly known as: SYNTHROID Take 112 mcg by mouth daily before breakfast.   MELATONIN PO Take 2 tablets by mouth at bedtime.   meloxicam 7.5 MG tablet Commonly known as: MOBIC Take 7.5 mg by mouth in the morning and at bedtime.   methocarbamol 500 MG tablet Commonly known as: ROBAXIN Take 1-2 tabs PO q 6 hrs prn muscle spasms   multivitamin with minerals tablet Take 1 tablet by mouth daily.   ondansetron 4 MG tablet Commonly known as: Zofran Take 1 tablet (4 mg total) by mouth every 8 (eight) hours as needed for up to 14 days for nausea or vomiting. What changed: Another medication with the same name was removed. Continue taking this medication, and follow the directions you see here.   oxyCODONE 5 MG immediate release tablet Commonly known as: Roxicodone Take 1 tablet (5 mg total) by mouth every 4 (four) hours as needed for up to 7 days for severe pain or moderate pain. What changed:  how much to take reasons to take this   pantoprazole 40 MG tablet Commonly known as: PROTONIX Take 40 mg by mouth daily.   polyethylene glycol 17 g packet Commonly known as: MIRALAX / GLYCOLAX Take 17 g by mouth daily as needed for moderate constipation.   rosuvastatin 20 MG tablet Commonly known as: CRESTOR Take 20 mg by mouth daily.   trolamine salicylate 10 % cream Commonly known as: ASPERCREME Apply 1 Application topically as needed for muscle pain.        Diagnostic Studies: DG Knee Right Port  Result Date: 01/24/2023 CLINICAL DATA:  Postop knee replacement. EXAM: PORTABLE RIGHT KNEE - 1-2 VIEW COMPARISON:  None Available. FINDINGS: Right knee arthroplasty in expected alignment. No periprosthetic lucency or fracture. There has been patellar resurfacing. Recent postsurgical change includes air and edema in the soft tissues and joint space. IMPRESSION:  Right knee arthroplasty without immediate postoperative complication. Electronically Signed   By: Keith Rake M.D.   On: 01/24/2023 16:49    Disposition: Discharge disposition: 01-Home or Self Care       Discharge Instructions     Call MD / Call 911   Complete by: As directed    If you experience chest pain or shortness of breath, CALL 911 and be transported to the hospital emergency room.  If you develope a fever above 101 F, pus (white drainage) or increased drainage or redness at the wound, or calf pain, call your surgeon's  office.   Constipation Prevention   Complete by: As directed    Drink plenty of fluids.  Prune juice may be helpful.  You may use a stool softener, such as Colace (over the counter) 100 mg twice a day.  Use MiraLax (over the counter) for constipation as needed.   Diet - low sodium heart healthy   Complete by: As directed    Increase activity slowly as tolerated   Complete by: As directed    Post-operative opioid taper instructions:   Complete by: As directed    POST-OPERATIVE OPIOID TAPER INSTRUCTIONS: It is important to wean off of your opioid medication as soon as possible. If you do not need pain medication after your surgery it is ok to stop day one. Opioids include: Codeine, Hydrocodone(Norco, Vicodin), Oxycodone(Percocet, oxycontin) and hydromorphone amongst others.  Long term and even short term use of opiods can cause: Increased pain response Dependence Constipation Depression Respiratory depression And more.  Withdrawal symptoms can include Flu like symptoms Nausea, vomiting And more Techniques to manage these symptoms Hydrate well Eat regular healthy meals Stay active Use relaxation techniques(deep breathing, meditating, yoga) Do Not substitute Alcohol to help with tapering If you have been on opioids for less than two weeks and do not have pain than it is ok to stop all together.  Plan to wean off of opioids This plan should start  within one week post op of your joint replacement. Maintain the same interval or time between taking each dose and first decrease the dose.  Cut the total daily intake of opioids by one tablet each day Next start to increase the time between doses. The last dose that should be eliminated is the evening dose.           Follow-up Information     Willaim Sheng, MD. Go on 02/08/2023.   Specialty: Orthopedic Surgery Why: Your appointment is scheduled for 2:30 Contact information: [address] [place] [address] [phone]         [facility] Follow up.   Specialty: Home Health Services Why: HHPT is scheduled to provide 6 home visits. will re-evaluate at post surgery appointment Contact information: [place] [address] [phone]                    Discharge Instructions      INSTRUCTIONS AFTER JOINT REPLACEMENT   Remove items at home which could result in a fall. This includes throw rugs or furniture in walking pathways ICE to the affected joint every three hours while awake for 30 minutes at a time, for at least the first 3-5 days, and then as needed for pain and swelling.  Continue to use ice for pain and swelling. You may notice swelling that will progress down to the foot and ankle.  This is normal after surgery.  Elevate your leg when you are not up walking on it.   Continue to use the breathing machine you got in the hospital (incentive spirometer) which will help keep your temperature down.  It is common for your temperature to cycle up and down following surgery, especially at night when you are not up moving around and exerting yourself.  The breathing machine keeps your lungs expanded and your temperature down.   DIET:  As you were doing prior to hospitalization, we recommend a well-balanced diet.  DRESSING / WOUND CARE / SHOWERING  Keep the surgical dressing until  follow up.  The dressing is  water proof, so you can shower without any extra covering.  IF THE DRESSING FALLS OFF or the wound gets wet inside, change the dressing with sterile gauze.  Please use good hand washing techniques before changing the dressing.  Do not use any lotions or creams on the incision until instructed by your surgeon.    ACTIVITY  Increase activity slowly as tolerated, but follow the weight bearing instructions below.   No driving for 6 weeks or until further direction given by your physician.  You cannot drive while taking narcotics.  No lifting or carrying greater than 10 lbs. until further directed by your surgeon. Avoid periods of inactivity such as sitting longer than an hour when not asleep. This helps prevent blood clots.  You may return to work once you are authorized by your doctor.     WEIGHT BEARING   Weight bearing as tolerated with assist device (walker, cane, etc) as directed, use it as long as suggested by your surgeon or therapist, typically at least 4-6 weeks.   EXERCISES  Results after joint replacement surgery are often greatly improved when you follow the exercise, range of motion and muscle strengthening exercises prescribed by your doctor. Safety measures are also important to protect the joint from further injury. Any time any of these exercises cause you to have increased pain or swelling, decrease what you are doing until you are comfortable again and then slowly increase them. If you have problems or questions, call your caregiver or physical therapist for advice.   Rehabilitation is important following a joint replacement. After just a few days of immobilization, the muscles of the leg can become weakened and shrink (atrophy).  These exercises are designed to build up the tone and strength of the thigh and leg muscles and to improve motion. Often times heat used for twenty to thirty minutes before working out will loosen up your tissues and help with improving the range of  motion but do not use heat for the first two weeks following surgery (sometimes heat can increase post-operative swelling).   These exercises can be done on a training (exercise) mat, on the floor, on a table or on a bed. Use whatever works the best and is most comfortable for you.    Use music or television while you are exercising so that the exercises are a pleasant break in your day. This will make your life better with the exercises acting as a break in your routine that you can look forward to.   Perform all exercises about fifteen times, three times per day or as directed.  You should exercise both the operative leg and the other leg as well.  Exercises include:   Quad Sets - Tighten up the muscle on the front of the thigh (Quad) and hold for 5-10 seconds.   Straight Leg Raises - With your knee straight (if you were given a brace, keep it on), lift the leg to 60 degrees, hold for 3 seconds, and slowly lower the leg.  Perform this exercise against resistance later as your leg gets stronger.  Leg Slides: Lying on your back, slowly slide your foot toward your buttocks, bending your knee up off the floor (only go as far as is comfortable). Then slowly slide your foot back down until your leg is flat on the floor again.  Angel Wings: Lying on your back spread your legs to the side as far apart as you can  without causing discomfort.  Hamstring Strength:  Lying on your back, push your heel against the floor with your leg straight by tightening up the muscles of your buttocks.  Repeat, but this time bend your knee to a comfortable angle, and push your heel against the floor.  You may put a pillow under the heel to make it more comfortable if necessary.   A rehabilitation program following joint replacement surgery can speed recovery and prevent re-injury in the future due to weakened muscles. Contact your doctor or a physical therapist for more information on knee rehabilitation.     CONSTIPATION  Constipation is defined medically as fewer than three stools per week and severe constipation as less than one stool per week.  Even if you have a regular bowel pattern at home, your normal regimen is likely to be disrupted due to multiple reasons following surgery.  Combination of anesthesia, postoperative narcotics, change in appetite and fluid intake all can affect your bowels.   YOU MUST use at least one of the following options; they are listed in order of increasing strength to get the job done.  They are all available over the counter, and you may need to use some, POSSIBLY even all of these options:    Drink plenty of fluids (prune juice may be helpful) and high fiber foods Colace 100 mg by mouth twice a day  Senokot for constipation as directed and as needed Dulcolax (bisacodyl), take with full glass of water  Miralax (polyethylene glycol) once or twice a day as needed.  If you have tried all these things and are unable to have a bowel movement in the first 3-4 days after surgery call either your surgeon or your primary doctor.    If you experience loose stools or diarrhea, hold the medications until you stool forms back up.  If your symptoms do not get better within 1 week or if they get worse, check with your doctor.  If you experience "the worst abdominal pain ever" or develop nausea or vomiting, please contact the office immediately for further recommendations for treatment.   ITCHING:  If you experience itching with your medications, try taking only a single pain pill, or even half a pain pill at a time.  You can also use Benadryl over the counter for itching or also to help with sleep.   TED HOSE STOCKINGS:  Use stockings on both legs until for at least 2 weeks or as directed by physician office. They may be removed at night for sleeping.  MEDICATIONS:  See your medication summary on the "After Visit Summary" that nursing will review with you.  You may have some  home medications which will be placed on hold until you complete the course of blood thinner medication.  It is important for you to complete the blood thinner medication as prescribed.   Blood clot prevention (DVT Prophylaxis): After surgery you are at an increased risk for a blood clot. you were prescribed a blood thinner, Aspirin 81mg , to be taken twice daily for a total of 4 weeks from surgery to help reduce your risk of getting a blood clot. This will help prevent a blood clot. Signs of a pulmonary embolus (blood clot in the lungs) include sudden short of breath, feeling lightheaded or dizzy, chest pain with a deep breath, rapid pulse rapid breathing. Signs of a blood clot in your arms or legs include new unexplained swelling and cramping, warm, red or darkened skin around the painful  area. Please call the office or 911 right away if these signs or symptoms develop.  PRECAUTIONS:  If you experience chest pain or shortness of breath - call 911 immediately for transfer to the hospital emergency department.   If you develop a fever greater that 101 F, purulent drainage from wound, increased redness or drainage from wound, foul odor from the wound/dressing, or calf pain - CONTACT YOUR SURGEON.                                                   FOLLOW-UP APPOINTMENTS:  If you do not already have a post-op appointment, please call the office for an appointment to be seen by your surgeon.  Guidelines for how soon to be seen are listed in your "After Visit Summary", but are typically between 2-3 weeks after surgery.  OTHER INSTRUCTIONS:   Knee Replacement:  Do not place pillow under knee, focus on keeping the knee straight while resting. CPM instructions: 0-90 degrees, 2 hours in the morning, 2 hours in the afternoon, and 2 hours in the evening. Place foam block, curve side up under heel at all times except when in CPM or when walking.  DO NOT modify, tear, cut, or change the foam block in any  way.  POST-OPERATIVE OPIOID TAPER INSTRUCTIONS: It is important to wean off of your opioid medication as soon as possible. If you do not need pain medication after your surgery it is ok to stop day one. Opioids include: Codeine, Hydrocodone(Norco, Vicodin), Oxycodone(Percocet, oxycontin) and hydromorphone amongst others.  Long term and even short term use of opiods can cause: Increased pain response Dependence Constipation Depression Respiratory depression And more.  Withdrawal symptoms can include Flu like symptoms Nausea, vomiting And more Techniques to manage these symptoms Hydrate well Eat regular healthy meals Stay active Use relaxation techniques(deep breathing, meditating, yoga) Do Not substitute Alcohol to help with tapering If you have been on opioids for less than two weeks and do not have pain than it is ok to stop all together.  Plan to wean off of opioids This plan should start within one week post op of your joint replacement. Maintain the same interval or time between taking each dose and first decrease the dose.  Cut the total daily intake of opioids by one tablet each day Next start to increase the time between doses. The last dose that should be eliminated is the evening dose.   MAKE SURE YOU:  Understand these instructions.  Get help right away if you are not doing well or get worse.    Thank you for letting us be a part of your medical care team.  It is a privilege we respect greatly.  We hope these instructions will help you stay on track for a fast and full recovery!            Signed: Jaye Polidori A Ikaika Showers 01/25/2023, 10:59 PM

## 2023-01-25 NOTE — Inpatient Diabetes Management (Signed)
Inpatient Diabetes Program Recommendations  AACE/ADA: New Consensus Statement on Inpatient Glycemic Control (2015)  Target Ranges:  Prepandial:   less than 140 mg/dL      Peak postprandial:   less than 180 mg/dL (1-2 hours)      Critically ill patients:  140 - 180 mg/dL   Lab Results  Component Value Date   GLUCAP 201 (H) 01/25/2023   HGBA1C 6.9 (H) 01/24/2023    Review of Glycemic Control  Diabetes history: DM2 Outpatient Diabetes medications: Trulicity 3 mg weekly, Jardiance 25 mg QD, Amaryl 2 mg QD,  Current orders for Inpatient glycemic control: Novolog 0-15 TID, dulaglutide 3 mg weekly on Sat, Jardiance 25 mg QD, Amaryl 2 mg QD  HgbA1C - 6.9% Received Decadron 5 mg during surgery on 4/1  Inpatient Diabetes Program Recommendations:   Spoke with pt at bedside regarding her blood sugars after surgery. Pt concerned they were higher than usual. Likely d/t receiving Decadron 5 mg during surgery. Pt has occasional hypos at home, treats with juice. Instructed to recheck in 15 minutes and if above 70, eat snack/meal with both carbs and protein. If below 70, then repeat. Pt states blood sugars are very well controlled and she follows up with MD on regular basis.  Appreciative of visit.   Thank you. Lorenda Peck, RD, LDN, [place] Inpatient Diabetes Coordinator [phone]

## 2023-01-25 NOTE — Evaluation (Signed)
Physical Therapy Evaluation Patient Details Name: [name] MRN: [id number] DOB: 11-19-1940 Today's Date: 01/25/2023  History of Present Illness  Pt is 82 yo female admitted 01/24/23 for R TKA.  Pt with hx including but not limited to arthritis, HTN, DM2, HLD, L THA, spinal cord stimulator, lumbar fusion, L TKA 11/11/2021 (pt reports needs revision)  Clinical Impression  Pt is s/p TKA resulting in the deficits listed below (see PT Problem List). At baseline, pt lives alone and is independent.  Today, pt presents with excellent quad activation, pain control, and ROM.  She was able to transfer and ambulate with min guard/supervision level.  Gait with near normal pattern with only slight decrease in weight shift to R.  Pt does have stairs to enter and plan to do stair training in pm.  Largest barrier to return home is lack of support.  Son lives next door but works long hours.  Pt has sister in law available if she calls but will not be staying with her.  Pt reports she likes to be independent, did well last time, and will not be setting up for anyone to stay with her.  Pt is mobilizing with supervision/min guard level - will further assess this afternoon.  However, expect no major changes in mobility and if pt not working on getting further support at home -then from therapy perspective would be able to d/c once discharged by MD.  Pt will benefit from acute skilled PT to increase their independence and safety with mobility to allow discharge.         Recommendations for follow up therapy are one component of a multi-disciplinary discharge planning process, led by the attending physician.  Recommendations may be updated based on patient status, additional functional criteria and insurance authorization.  Follow Up Recommendations       Assistance Recommended at Discharge Intermittent Supervision/Assistance  Patient can return home with the following  A little help with walking and/or transfers;A  little help with bathing/dressing/bathroom;Assistance with cooking/housework;Help with stairs or ramp for entrance    Equipment Recommendations None recommended by PT  Recommendations for Other Services       Functional Status Assessment Patient has had a recent decline in their functional status and demonstrates the ability to make significant improvements in function in a reasonable and predictable amount of time.     Precautions / Restrictions Precautions Precautions: Fall Restrictions Weight Bearing Restrictions: Yes RLE Weight Bearing: Weight bearing as tolerated      Mobility  Bed Mobility Overal bed mobility: Needs Assistance Bed Mobility: Supine to Sit     Supine to sit: Supervision     General bed mobility comments: Performed wtihout difficulty    Transfers Overall transfer level: Needs assistance Equipment used: Rolling walker (2 wheels) Transfers: Sit to/from Stand Sit to Stand: Min guard           General transfer comment: From bed min guard with cues for hand placement; from low toilet required grab bar and HHA (reports has rails or cabinet on both sides at home)    Ambulation/Gait Ambulation/Gait assistance: Supervision, Min guard Gait Distance (Feet): 80 Feet Assistive device: Rolling walker (2 wheels) Gait Pattern/deviations: Step-through pattern Gait velocity: decreased     General Gait Details: Initial cues for sequence and RW proximity with good carry over; steady gait with only slight decrease in weight shift to R; min guard progressed to supervision  Stairs  Wheelchair Mobility    Modified Rankin (Stroke Patients Only)       Balance Overall balance assessment: Needs assistance Sitting-balance support: No upper extremity supported Sitting balance-Leahy Scale: Good     Standing balance support: Bilateral upper extremity supported, No upper extremity supported Standing balance-Leahy Scale: Fair Standing balance  comment: RW to ambulate but able to do toileting ADLs in standing wtihout assist.  (pt donned underwear sitting and was able to pull up standing)                             Pertinent Vitals/Pain Pain Assessment Pain Assessment: No/denies pain    Home Living Family/patient expects to be discharged to:: Private residence Living Arrangements: Alone Available Help at Discharge: Family;Available PRN/intermittently (Son lives next door but he and his wife work. They both work full time jobs with long hours (>40 hrs/week). Reports sister in law very availble to come if called but will not stay) Type of Home: House Home Access: Stairs to enter Entrance Stairs-Rails: Right;Left Entrance Stairs-Number of Steps: 3   Home Layout: One level Home Equipment: Conservation officer, nature (2 wheels);Cane - single point;Shower seat;BSC/3in1      Prior Function Prior Level of Function : Independent/Modified Independent;Driving             Mobility Comments: Could ambulate in community; did use cane in community, does use RW in middle of night to go to bathroom ADLs Comments: independent adls and iadls     Hand Dominance   Dominant Hand: Right    Extremity/Trunk Assessment   Upper Extremity Assessment Upper Extremity Assessment: Overall WFL for tasks assessed    Lower Extremity Assessment Lower Extremity Assessment: LLE deficits/detail;RLE deficits/detail RLE Deficits / Details: Expected post op changes; ROM Knee ~5 to 90 degrees; MMT: ankle 5/5, knee and hip 3/5 not further tested LLE Deficits / Details: ROM WFL ;MMT 5/5    Cervical / Trunk Assessment Cervical / Trunk Assessment: Normal  Communication   Communication: No difficulties  Cognition Arousal/Alertness: Awake/alert Behavior During Therapy: WFL for tasks assessed/performed Overall Cognitive Status: Within Functional Limits for tasks assessed                                          General Comments       Exercises Total Joint Exercises Ankle Circles/Pumps: AROM, Both, 10 reps, Supine Quad Sets: AROM, Both, 10 reps, Supine Long Arc Quad: AROM, Both, 10 reps, Seated Knee Flexion: AROM, Both, 10 reps, Seated Goniometric ROM: R knee 5 to 90 degrees   Assessment/Plan    PT Assessment Patient needs continued PT services  PT Problem List Decreased strength;Pain;Decreased range of motion;Decreased balance;Decreased activity tolerance;Decreased mobility;Decreased knowledge of precautions;Decreased knowledge of use of DME;Decreased safety awareness       PT Treatment Interventions DME instruction;Therapeutic exercise;Gait training;Stair training;Functional mobility training;Therapeutic activities;Patient/family education;Modalities;Balance training    PT Goals (Current goals can be found in the Care Plan section)  Acute Rehab PT Goals Patient Stated Goal: return home; be independent PT Goal Formulation: With patient Time For Goal Achievement: 02/08/23 Potential to Achieve Goals: Good    Frequency 7X/week     Co-evaluation               AM-PAC PT "6 Clicks" Mobility  Outcome Measure Help needed turning from your back to your side  while in a flat bed without using bedrails?: A Little Help needed moving from lying on your back to sitting on the side of a flat bed without using bedrails?: A Little Help needed moving to and from a bed to a chair (including a wheelchair)?: A Little Help needed standing up from a chair using your arms (e.g., wheelchair or bedside chair)?: A Little Help needed to walk in hospital room?: A Little Help needed climbing 3-5 steps with a railing? : A Little 6 Click Score: 18    End of Session Equipment Utilized During Treatment: Gait belt Activity Tolerance: Patient tolerated treatment well Patient left: with call bell/phone within reach;in chair;with chair alarm set Nurse Communication: Mobility status PT Visit Diagnosis: Other abnormalities of  gait and mobility (R26.89);Muscle weakness (generalized) (M62.81)    Time: [id number] PT Time Calculation (min) (ACUTE ONLY): 38 min   Charges:   PT Evaluation $PT Eval Low Complexity: 1 Low PT Treatments $Gait Training: 8-22 mins $Therapeutic Exercise: 8-22 mins        Abran Richard, PT Acute Rehab [facility] [phone]   Karlton Lemon 01/25/2023, 2:20 PM

## 2023-01-26 ENCOUNTER — Encounter (HOSPITAL_COMMUNITY): Payer: Self-pay | Admitting: Orthopedic Surgery

## 2023-01-26 DIAGNOSIS — B029 Zoster without complications: Secondary | ICD-10-CM | POA: Diagnosis not present

## 2023-01-26 DIAGNOSIS — Z87891 Personal history of nicotine dependence: Secondary | ICD-10-CM | POA: Diagnosis not present

## 2023-01-26 DIAGNOSIS — R011 Cardiac murmur, unspecified: Secondary | ICD-10-CM | POA: Diagnosis not present

## 2023-01-26 DIAGNOSIS — Z9682 Presence of neurostimulator: Secondary | ICD-10-CM | POA: Diagnosis not present

## 2023-01-26 DIAGNOSIS — Z9181 History of falling: Secondary | ICD-10-CM | POA: Diagnosis not present

## 2023-01-26 DIAGNOSIS — Z981 Arthrodesis status: Secondary | ICD-10-CM | POA: Diagnosis not present

## 2023-01-26 DIAGNOSIS — Z7984 Long term (current) use of oral hypoglycemic drugs: Secondary | ICD-10-CM | POA: Diagnosis not present

## 2023-01-26 DIAGNOSIS — I4719 Other supraventricular tachycardia: Secondary | ICD-10-CM | POA: Diagnosis not present

## 2023-01-26 DIAGNOSIS — E039 Hypothyroidism, unspecified: Secondary | ICD-10-CM | POA: Diagnosis not present

## 2023-01-26 DIAGNOSIS — Z96642 Presence of left artificial hip joint: Secondary | ICD-10-CM | POA: Diagnosis not present

## 2023-01-26 DIAGNOSIS — Z794 Long term (current) use of insulin: Secondary | ICD-10-CM | POA: Diagnosis not present

## 2023-01-26 DIAGNOSIS — E785 Hyperlipidemia, unspecified: Secondary | ICD-10-CM | POA: Diagnosis not present

## 2023-01-26 DIAGNOSIS — Z85828 Personal history of other malignant neoplasm of skin: Secondary | ICD-10-CM | POA: Diagnosis not present

## 2023-01-26 DIAGNOSIS — Z96651 Presence of right artificial knee joint: Secondary | ICD-10-CM | POA: Diagnosis not present

## 2023-01-26 DIAGNOSIS — G8929 Other chronic pain: Secondary | ICD-10-CM | POA: Diagnosis not present

## 2023-01-26 DIAGNOSIS — Z96652 Presence of left artificial knee joint: Secondary | ICD-10-CM | POA: Diagnosis not present

## 2023-01-26 DIAGNOSIS — E119 Type 2 diabetes mellitus without complications: Secondary | ICD-10-CM | POA: Diagnosis not present

## 2023-01-26 DIAGNOSIS — Z85038 Personal history of other malignant neoplasm of large intestine: Secondary | ICD-10-CM | POA: Diagnosis not present

## 2023-01-26 DIAGNOSIS — Z7982 Long term (current) use of aspirin: Secondary | ICD-10-CM | POA: Diagnosis not present

## 2023-01-26 DIAGNOSIS — Z471 Aftercare following joint replacement surgery: Secondary | ICD-10-CM | POA: Diagnosis not present

## 2023-01-26 DIAGNOSIS — Z791 Long term (current) use of non-steroidal anti-inflammatories (NSAID): Secondary | ICD-10-CM | POA: Diagnosis not present

## 2023-01-26 DIAGNOSIS — I10 Essential (primary) hypertension: Secondary | ICD-10-CM | POA: Diagnosis not present

## 2023-01-26 DIAGNOSIS — K5190 Ulcerative colitis, unspecified, without complications: Secondary | ICD-10-CM | POA: Diagnosis not present

## 2023-01-31 DIAGNOSIS — E039 Hypothyroidism, unspecified: Secondary | ICD-10-CM | POA: Diagnosis not present

## 2023-01-31 DIAGNOSIS — K5190 Ulcerative colitis, unspecified, without complications: Secondary | ICD-10-CM | POA: Diagnosis not present

## 2023-01-31 DIAGNOSIS — Z471 Aftercare following joint replacement surgery: Secondary | ICD-10-CM | POA: Diagnosis not present

## 2023-01-31 DIAGNOSIS — Z794 Long term (current) use of insulin: Secondary | ICD-10-CM | POA: Diagnosis not present

## 2023-01-31 DIAGNOSIS — Z96652 Presence of left artificial knee joint: Secondary | ICD-10-CM | POA: Diagnosis not present

## 2023-01-31 DIAGNOSIS — B029 Zoster without complications: Secondary | ICD-10-CM | POA: Diagnosis not present

## 2023-01-31 DIAGNOSIS — Z87891 Personal history of nicotine dependence: Secondary | ICD-10-CM | POA: Diagnosis not present

## 2023-01-31 DIAGNOSIS — I4719 Other supraventricular tachycardia: Secondary | ICD-10-CM | POA: Diagnosis not present

## 2023-01-31 DIAGNOSIS — Z981 Arthrodesis status: Secondary | ICD-10-CM | POA: Diagnosis not present

## 2023-01-31 DIAGNOSIS — E119 Type 2 diabetes mellitus without complications: Secondary | ICD-10-CM | POA: Diagnosis not present

## 2023-01-31 DIAGNOSIS — I10 Essential (primary) hypertension: Secondary | ICD-10-CM | POA: Diagnosis not present

## 2023-01-31 DIAGNOSIS — Z9682 Presence of neurostimulator: Secondary | ICD-10-CM | POA: Diagnosis not present

## 2023-01-31 DIAGNOSIS — R011 Cardiac murmur, unspecified: Secondary | ICD-10-CM | POA: Diagnosis not present

## 2023-01-31 DIAGNOSIS — Z7982 Long term (current) use of aspirin: Secondary | ICD-10-CM | POA: Diagnosis not present

## 2023-01-31 DIAGNOSIS — Z7984 Long term (current) use of oral hypoglycemic drugs: Secondary | ICD-10-CM | POA: Diagnosis not present

## 2023-01-31 DIAGNOSIS — E785 Hyperlipidemia, unspecified: Secondary | ICD-10-CM | POA: Diagnosis not present

## 2023-01-31 DIAGNOSIS — Z791 Long term (current) use of non-steroidal anti-inflammatories (NSAID): Secondary | ICD-10-CM | POA: Diagnosis not present

## 2023-01-31 DIAGNOSIS — Z9181 History of falling: Secondary | ICD-10-CM | POA: Diagnosis not present

## 2023-01-31 DIAGNOSIS — Z96651 Presence of right artificial knee joint: Secondary | ICD-10-CM | POA: Diagnosis not present

## 2023-01-31 DIAGNOSIS — Z85038 Personal history of other malignant neoplasm of large intestine: Secondary | ICD-10-CM | POA: Diagnosis not present

## 2023-01-31 DIAGNOSIS — Z96642 Presence of left artificial hip joint: Secondary | ICD-10-CM | POA: Diagnosis not present

## 2023-01-31 DIAGNOSIS — G8929 Other chronic pain: Secondary | ICD-10-CM | POA: Diagnosis not present

## 2023-01-31 DIAGNOSIS — Z85828 Personal history of other malignant neoplasm of skin: Secondary | ICD-10-CM | POA: Diagnosis not present

## 2023-02-08 DIAGNOSIS — M1711 Unilateral primary osteoarthritis, right knee: Secondary | ICD-10-CM | POA: Diagnosis not present

## 2023-02-08 NOTE — TOC CM/SW Note (Signed)
CSW received call from [place] with [facility] regarding the referral to custodial care. Mary to follow up with patient to see if the care is still needed.

## 2023-02-09 DIAGNOSIS — Z9682 Presence of neurostimulator: Secondary | ICD-10-CM | POA: Diagnosis not present

## 2023-02-09 DIAGNOSIS — G8929 Other chronic pain: Secondary | ICD-10-CM | POA: Diagnosis not present

## 2023-02-09 DIAGNOSIS — Z471 Aftercare following joint replacement surgery: Secondary | ICD-10-CM | POA: Diagnosis not present

## 2023-02-09 DIAGNOSIS — Z9181 History of falling: Secondary | ICD-10-CM | POA: Diagnosis not present

## 2023-02-09 DIAGNOSIS — E119 Type 2 diabetes mellitus without complications: Secondary | ICD-10-CM | POA: Diagnosis not present

## 2023-02-09 DIAGNOSIS — Z791 Long term (current) use of non-steroidal anti-inflammatories (NSAID): Secondary | ICD-10-CM | POA: Diagnosis not present

## 2023-02-09 DIAGNOSIS — Z96642 Presence of left artificial hip joint: Secondary | ICD-10-CM | POA: Diagnosis not present

## 2023-02-09 DIAGNOSIS — Z96651 Presence of right artificial knee joint: Secondary | ICD-10-CM | POA: Diagnosis not present

## 2023-02-09 DIAGNOSIS — Z7984 Long term (current) use of oral hypoglycemic drugs: Secondary | ICD-10-CM | POA: Diagnosis not present

## 2023-02-09 DIAGNOSIS — K5190 Ulcerative colitis, unspecified, without complications: Secondary | ICD-10-CM | POA: Diagnosis not present

## 2023-02-09 DIAGNOSIS — B029 Zoster without complications: Secondary | ICD-10-CM | POA: Diagnosis not present

## 2023-02-09 DIAGNOSIS — Z7982 Long term (current) use of aspirin: Secondary | ICD-10-CM | POA: Diagnosis not present

## 2023-02-09 DIAGNOSIS — E039 Hypothyroidism, unspecified: Secondary | ICD-10-CM | POA: Diagnosis not present

## 2023-02-09 DIAGNOSIS — Z87891 Personal history of nicotine dependence: Secondary | ICD-10-CM | POA: Diagnosis not present

## 2023-02-09 DIAGNOSIS — Z85038 Personal history of other malignant neoplasm of large intestine: Secondary | ICD-10-CM | POA: Diagnosis not present

## 2023-02-09 DIAGNOSIS — E785 Hyperlipidemia, unspecified: Secondary | ICD-10-CM | POA: Diagnosis not present

## 2023-02-09 DIAGNOSIS — Z85828 Personal history of other malignant neoplasm of skin: Secondary | ICD-10-CM | POA: Diagnosis not present

## 2023-02-09 DIAGNOSIS — I4719 Other supraventricular tachycardia: Secondary | ICD-10-CM | POA: Diagnosis not present

## 2023-02-09 DIAGNOSIS — Z794 Long term (current) use of insulin: Secondary | ICD-10-CM | POA: Diagnosis not present

## 2023-02-09 DIAGNOSIS — Z981 Arthrodesis status: Secondary | ICD-10-CM | POA: Diagnosis not present

## 2023-02-09 DIAGNOSIS — Z96652 Presence of left artificial knee joint: Secondary | ICD-10-CM | POA: Diagnosis not present

## 2023-02-09 DIAGNOSIS — R011 Cardiac murmur, unspecified: Secondary | ICD-10-CM | POA: Diagnosis not present

## 2023-02-09 DIAGNOSIS — I10 Essential (primary) hypertension: Secondary | ICD-10-CM | POA: Diagnosis not present

## 2023-03-08 DIAGNOSIS — M1711 Unilateral primary osteoarthritis, right knee: Secondary | ICD-10-CM | POA: Diagnosis not present

## 2023-03-22 DIAGNOSIS — F5101 Primary insomnia: Secondary | ICD-10-CM | POA: Diagnosis not present

## 2023-03-30 DIAGNOSIS — E119 Type 2 diabetes mellitus without complications: Secondary | ICD-10-CM | POA: Diagnosis not present

## 2023-03-30 DIAGNOSIS — H2513 Age-related nuclear cataract, bilateral: Secondary | ICD-10-CM | POA: Diagnosis not present

## 2023-04-19 DIAGNOSIS — M1711 Unilateral primary osteoarthritis, right knee: Secondary | ICD-10-CM | POA: Diagnosis not present

## 2023-05-11 DIAGNOSIS — Z6834 Body mass index (BMI) 34.0-34.9, adult: Secondary | ICD-10-CM | POA: Diagnosis not present

## 2023-05-11 DIAGNOSIS — K5190 Ulcerative colitis, unspecified, without complications: Secondary | ICD-10-CM | POA: Diagnosis not present

## 2023-05-11 DIAGNOSIS — E7800 Pure hypercholesterolemia, unspecified: Secondary | ICD-10-CM | POA: Diagnosis not present

## 2023-05-11 DIAGNOSIS — E669 Obesity, unspecified: Secondary | ICD-10-CM | POA: Diagnosis not present

## 2023-05-11 DIAGNOSIS — E1169 Type 2 diabetes mellitus with other specified complication: Secondary | ICD-10-CM | POA: Diagnosis not present

## 2023-05-11 DIAGNOSIS — Z1389 Encounter for screening for other disorder: Secondary | ICD-10-CM | POA: Diagnosis not present

## 2023-05-11 DIAGNOSIS — Z008 Encounter for other general examination: Secondary | ICD-10-CM | POA: Diagnosis not present

## 2023-05-11 DIAGNOSIS — I471 Supraventricular tachycardia, unspecified: Secondary | ICD-10-CM | POA: Diagnosis not present

## 2023-05-11 DIAGNOSIS — Z85038 Personal history of other malignant neoplasm of large intestine: Secondary | ICD-10-CM | POA: Diagnosis not present

## 2023-05-11 DIAGNOSIS — Z0000 Encounter for general adult medical examination without abnormal findings: Secondary | ICD-10-CM | POA: Diagnosis not present

## 2023-06-20 DIAGNOSIS — L820 Inflamed seborrheic keratosis: Secondary | ICD-10-CM | POA: Diagnosis not present

## 2023-06-20 DIAGNOSIS — L570 Actinic keratosis: Secondary | ICD-10-CM | POA: Diagnosis not present

## 2023-06-20 DIAGNOSIS — L578 Other skin changes due to chronic exposure to nonionizing radiation: Secondary | ICD-10-CM | POA: Diagnosis not present

## 2023-06-20 DIAGNOSIS — L814 Other melanin hyperpigmentation: Secondary | ICD-10-CM | POA: Diagnosis not present

## 2023-06-28 DIAGNOSIS — H40033 Anatomical narrow angle, bilateral: Secondary | ICD-10-CM | POA: Diagnosis not present

## 2023-06-28 DIAGNOSIS — Z01818 Encounter for other preprocedural examination: Secondary | ICD-10-CM | POA: Diagnosis not present

## 2023-06-28 DIAGNOSIS — H40013 Open angle with borderline findings, low risk, bilateral: Secondary | ICD-10-CM | POA: Diagnosis not present

## 2023-06-28 DIAGNOSIS — E119 Type 2 diabetes mellitus without complications: Secondary | ICD-10-CM | POA: Diagnosis not present

## 2023-06-28 DIAGNOSIS — H25812 Combined forms of age-related cataract, left eye: Secondary | ICD-10-CM | POA: Diagnosis not present

## 2023-07-05 DIAGNOSIS — H25812 Combined forms of age-related cataract, left eye: Secondary | ICD-10-CM | POA: Diagnosis not present

## 2023-07-05 DIAGNOSIS — H02834 Dermatochalasis of left upper eyelid: Secondary | ICD-10-CM | POA: Diagnosis not present

## 2023-07-05 DIAGNOSIS — H40003 Preglaucoma, unspecified, bilateral: Secondary | ICD-10-CM | POA: Diagnosis not present

## 2023-07-05 DIAGNOSIS — H40033 Anatomical narrow angle, bilateral: Secondary | ICD-10-CM | POA: Diagnosis not present

## 2023-07-05 DIAGNOSIS — E119 Type 2 diabetes mellitus without complications: Secondary | ICD-10-CM | POA: Diagnosis not present

## 2023-07-05 DIAGNOSIS — E1136 Type 2 diabetes mellitus with diabetic cataract: Secondary | ICD-10-CM | POA: Diagnosis not present

## 2023-07-05 DIAGNOSIS — H47393 Other disorders of optic disc, bilateral: Secondary | ICD-10-CM | POA: Diagnosis not present

## 2023-07-05 DIAGNOSIS — H02831 Dermatochalasis of right upper eyelid: Secondary | ICD-10-CM | POA: Diagnosis not present

## 2023-07-05 DIAGNOSIS — H259 Unspecified age-related cataract: Secondary | ICD-10-CM | POA: Diagnosis not present

## 2023-08-02 DIAGNOSIS — Z794 Long term (current) use of insulin: Secondary | ICD-10-CM | POA: Diagnosis not present

## 2023-08-02 DIAGNOSIS — E119 Type 2 diabetes mellitus without complications: Secondary | ICD-10-CM | POA: Diagnosis not present

## 2023-08-02 DIAGNOSIS — K219 Gastro-esophageal reflux disease without esophagitis: Secondary | ICD-10-CM | POA: Diagnosis not present

## 2023-08-02 DIAGNOSIS — Z79899 Other long term (current) drug therapy: Secondary | ICD-10-CM | POA: Diagnosis not present

## 2023-08-02 DIAGNOSIS — E039 Hypothyroidism, unspecified: Secondary | ICD-10-CM | POA: Diagnosis not present

## 2023-08-02 DIAGNOSIS — E785 Hyperlipidemia, unspecified: Secondary | ICD-10-CM | POA: Diagnosis not present

## 2023-08-02 DIAGNOSIS — H40003 Preglaucoma, unspecified, bilateral: Secondary | ICD-10-CM | POA: Diagnosis not present

## 2023-08-02 DIAGNOSIS — H259 Unspecified age-related cataract: Secondary | ICD-10-CM | POA: Diagnosis not present

## 2023-08-02 DIAGNOSIS — M1990 Unspecified osteoarthritis, unspecified site: Secondary | ICD-10-CM | POA: Diagnosis not present

## 2023-08-02 DIAGNOSIS — I10 Essential (primary) hypertension: Secondary | ICD-10-CM | POA: Diagnosis not present

## 2023-08-02 DIAGNOSIS — E1136 Type 2 diabetes mellitus with diabetic cataract: Secondary | ICD-10-CM | POA: Diagnosis not present

## 2023-08-02 DIAGNOSIS — Z87891 Personal history of nicotine dependence: Secondary | ICD-10-CM | POA: Diagnosis not present

## 2023-08-02 DIAGNOSIS — H25811 Combined forms of age-related cataract, right eye: Secondary | ICD-10-CM | POA: Diagnosis not present

## 2023-08-11 DIAGNOSIS — Z23 Encounter for immunization: Secondary | ICD-10-CM | POA: Diagnosis not present

## 2023-08-11 DIAGNOSIS — I10 Essential (primary) hypertension: Secondary | ICD-10-CM | POA: Diagnosis not present

## 2023-08-11 DIAGNOSIS — I471 Supraventricular tachycardia, unspecified: Secondary | ICD-10-CM | POA: Diagnosis not present

## 2023-08-11 DIAGNOSIS — E1169 Type 2 diabetes mellitus with other specified complication: Secondary | ICD-10-CM | POA: Diagnosis not present

## 2023-08-11 DIAGNOSIS — E7800 Pure hypercholesterolemia, unspecified: Secondary | ICD-10-CM | POA: Diagnosis not present

## 2023-08-11 DIAGNOSIS — K219 Gastro-esophageal reflux disease without esophagitis: Secondary | ICD-10-CM | POA: Diagnosis not present

## 2023-08-11 DIAGNOSIS — Z6836 Body mass index (BMI) 36.0-36.9, adult: Secondary | ICD-10-CM | POA: Diagnosis not present

## 2023-08-11 DIAGNOSIS — F3342 Major depressive disorder, recurrent, in full remission: Secondary | ICD-10-CM | POA: Diagnosis not present

## 2023-08-11 DIAGNOSIS — E669 Obesity, unspecified: Secondary | ICD-10-CM | POA: Diagnosis not present

## 2023-08-11 DIAGNOSIS — I671 Cerebral aneurysm, nonruptured: Secondary | ICD-10-CM | POA: Diagnosis not present

## 2023-08-11 DIAGNOSIS — E038 Other specified hypothyroidism: Secondary | ICD-10-CM | POA: Diagnosis not present

## 2023-11-10 DIAGNOSIS — F3342 Major depressive disorder, recurrent, in full remission: Secondary | ICD-10-CM | POA: Diagnosis not present

## 2023-11-10 DIAGNOSIS — E7800 Pure hypercholesterolemia, unspecified: Secondary | ICD-10-CM | POA: Diagnosis not present

## 2023-11-10 DIAGNOSIS — E669 Obesity, unspecified: Secondary | ICD-10-CM | POA: Diagnosis not present

## 2023-11-10 DIAGNOSIS — E038 Other specified hypothyroidism: Secondary | ICD-10-CM | POA: Diagnosis not present

## 2023-11-10 DIAGNOSIS — I471 Supraventricular tachycardia, unspecified: Secondary | ICD-10-CM | POA: Diagnosis not present

## 2023-11-10 DIAGNOSIS — E1169 Type 2 diabetes mellitus with other specified complication: Secondary | ICD-10-CM | POA: Diagnosis not present

## 2023-11-10 DIAGNOSIS — I10 Essential (primary) hypertension: Secondary | ICD-10-CM | POA: Diagnosis not present

## 2023-11-10 DIAGNOSIS — Z6836 Body mass index (BMI) 36.0-36.9, adult: Secondary | ICD-10-CM | POA: Diagnosis not present

## 2023-11-10 DIAGNOSIS — K219 Gastro-esophageal reflux disease without esophagitis: Secondary | ICD-10-CM | POA: Diagnosis not present

## 2024-02-09 DIAGNOSIS — I471 Supraventricular tachycardia, unspecified: Secondary | ICD-10-CM | POA: Diagnosis not present

## 2024-02-09 DIAGNOSIS — E039 Hypothyroidism, unspecified: Secondary | ICD-10-CM | POA: Diagnosis not present

## 2024-02-09 DIAGNOSIS — K219 Gastro-esophageal reflux disease without esophagitis: Secondary | ICD-10-CM | POA: Diagnosis not present

## 2024-02-09 DIAGNOSIS — E1169 Type 2 diabetes mellitus with other specified complication: Secondary | ICD-10-CM | POA: Diagnosis not present

## 2024-02-09 DIAGNOSIS — E669 Obesity, unspecified: Secondary | ICD-10-CM | POA: Diagnosis not present

## 2024-02-09 DIAGNOSIS — F3342 Major depressive disorder, recurrent, in full remission: Secondary | ICD-10-CM | POA: Diagnosis not present

## 2024-02-09 DIAGNOSIS — E7800 Pure hypercholesterolemia, unspecified: Secondary | ICD-10-CM | POA: Diagnosis not present

## 2024-02-09 DIAGNOSIS — I10 Essential (primary) hypertension: Secondary | ICD-10-CM | POA: Diagnosis not present

## 2024-02-09 DIAGNOSIS — Z6836 Body mass index (BMI) 36.0-36.9, adult: Secondary | ICD-10-CM | POA: Diagnosis not present

## 2024-03-24 DIAGNOSIS — M150 Primary generalized (osteo)arthritis: Secondary | ICD-10-CM | POA: Diagnosis not present

## 2024-03-24 DIAGNOSIS — Z79899 Other long term (current) drug therapy: Secondary | ICD-10-CM | POA: Diagnosis not present

## 2024-03-24 DIAGNOSIS — E7800 Pure hypercholesterolemia, unspecified: Secondary | ICD-10-CM | POA: Diagnosis not present

## 2024-03-24 DIAGNOSIS — I10 Essential (primary) hypertension: Secondary | ICD-10-CM | POA: Diagnosis not present

## 2024-03-24 DIAGNOSIS — E1169 Type 2 diabetes mellitus with other specified complication: Secondary | ICD-10-CM | POA: Diagnosis not present

## 2024-03-24 DIAGNOSIS — K219 Gastro-esophageal reflux disease without esophagitis: Secondary | ICD-10-CM | POA: Diagnosis not present

## 2024-04-23 DIAGNOSIS — Z79899 Other long term (current) drug therapy: Secondary | ICD-10-CM | POA: Diagnosis not present

## 2024-04-23 DIAGNOSIS — E1169 Type 2 diabetes mellitus with other specified complication: Secondary | ICD-10-CM | POA: Diagnosis not present

## 2024-04-23 DIAGNOSIS — K219 Gastro-esophageal reflux disease without esophagitis: Secondary | ICD-10-CM | POA: Diagnosis not present

## 2024-04-23 DIAGNOSIS — I10 Essential (primary) hypertension: Secondary | ICD-10-CM | POA: Diagnosis not present

## 2024-04-23 DIAGNOSIS — E7800 Pure hypercholesterolemia, unspecified: Secondary | ICD-10-CM | POA: Diagnosis not present

## 2024-04-23 DIAGNOSIS — M150 Primary generalized (osteo)arthritis: Secondary | ICD-10-CM | POA: Diagnosis not present

## 2024-05-15 DIAGNOSIS — S2239XA Fracture of one rib, unspecified side, initial encounter for closed fracture: Secondary | ICD-10-CM | POA: Diagnosis not present

## 2024-05-24 DIAGNOSIS — K219 Gastro-esophageal reflux disease without esophagitis: Secondary | ICD-10-CM | POA: Diagnosis not present

## 2024-05-24 DIAGNOSIS — I10 Essential (primary) hypertension: Secondary | ICD-10-CM | POA: Diagnosis not present

## 2024-05-24 DIAGNOSIS — M810 Age-related osteoporosis without current pathological fracture: Secondary | ICD-10-CM | POA: Diagnosis not present

## 2024-05-24 DIAGNOSIS — G894 Chronic pain syndrome: Secondary | ICD-10-CM | POA: Diagnosis not present

## 2024-05-24 DIAGNOSIS — E1169 Type 2 diabetes mellitus with other specified complication: Secondary | ICD-10-CM | POA: Diagnosis not present

## 2024-05-24 DIAGNOSIS — E7800 Pure hypercholesterolemia, unspecified: Secondary | ICD-10-CM | POA: Diagnosis not present

## 2024-05-24 DIAGNOSIS — Z79899 Other long term (current) drug therapy: Secondary | ICD-10-CM | POA: Diagnosis not present

## 2024-05-31 DIAGNOSIS — L219 Seborrheic dermatitis, unspecified: Secondary | ICD-10-CM | POA: Diagnosis not present

## 2024-05-31 DIAGNOSIS — L570 Actinic keratosis: Secondary | ICD-10-CM | POA: Diagnosis not present

## 2024-05-31 DIAGNOSIS — L814 Other melanin hyperpigmentation: Secondary | ICD-10-CM | POA: Diagnosis not present

## 2024-05-31 DIAGNOSIS — L299 Pruritus, unspecified: Secondary | ICD-10-CM | POA: Diagnosis not present

## 2024-05-31 DIAGNOSIS — D225 Melanocytic nevi of trunk: Secondary | ICD-10-CM | POA: Diagnosis not present

## 2024-06-11 DIAGNOSIS — K5190 Ulcerative colitis, unspecified, without complications: Secondary | ICD-10-CM | POA: Diagnosis not present

## 2024-06-11 DIAGNOSIS — Z1389 Encounter for screening for other disorder: Secondary | ICD-10-CM | POA: Diagnosis not present

## 2024-06-11 DIAGNOSIS — I471 Supraventricular tachycardia, unspecified: Secondary | ICD-10-CM | POA: Diagnosis not present

## 2024-06-11 DIAGNOSIS — Z6835 Body mass index (BMI) 35.0-35.9, adult: Secondary | ICD-10-CM | POA: Diagnosis not present

## 2024-06-11 DIAGNOSIS — E1169 Type 2 diabetes mellitus with other specified complication: Secondary | ICD-10-CM | POA: Diagnosis not present

## 2024-06-11 DIAGNOSIS — M810 Age-related osteoporosis without current pathological fracture: Secondary | ICD-10-CM | POA: Diagnosis not present

## 2024-06-11 DIAGNOSIS — E039 Hypothyroidism, unspecified: Secondary | ICD-10-CM | POA: Diagnosis not present

## 2024-06-11 DIAGNOSIS — Z85038 Personal history of other malignant neoplasm of large intestine: Secondary | ICD-10-CM | POA: Diagnosis not present

## 2024-06-11 DIAGNOSIS — Z0000 Encounter for general adult medical examination without abnormal findings: Secondary | ICD-10-CM | POA: Diagnosis not present

## 2024-06-24 DIAGNOSIS — E7800 Pure hypercholesterolemia, unspecified: Secondary | ICD-10-CM | POA: Diagnosis not present

## 2024-06-24 DIAGNOSIS — Z79899 Other long term (current) drug therapy: Secondary | ICD-10-CM | POA: Diagnosis not present

## 2024-06-24 DIAGNOSIS — K219 Gastro-esophageal reflux disease without esophagitis: Secondary | ICD-10-CM | POA: Diagnosis not present

## 2024-06-24 DIAGNOSIS — I10 Essential (primary) hypertension: Secondary | ICD-10-CM | POA: Diagnosis not present

## 2024-06-24 DIAGNOSIS — M150 Primary generalized (osteo)arthritis: Secondary | ICD-10-CM | POA: Diagnosis not present

## 2024-07-24 DIAGNOSIS — E7800 Pure hypercholesterolemia, unspecified: Secondary | ICD-10-CM | POA: Diagnosis not present

## 2024-07-24 DIAGNOSIS — G894 Chronic pain syndrome: Secondary | ICD-10-CM | POA: Diagnosis not present

## 2024-07-24 DIAGNOSIS — Z79899 Other long term (current) drug therapy: Secondary | ICD-10-CM | POA: Diagnosis not present

## 2024-07-24 DIAGNOSIS — K219 Gastro-esophageal reflux disease without esophagitis: Secondary | ICD-10-CM | POA: Diagnosis not present

## 2024-07-24 DIAGNOSIS — M150 Primary generalized (osteo)arthritis: Secondary | ICD-10-CM | POA: Diagnosis not present

## 2024-07-24 DIAGNOSIS — I10 Essential (primary) hypertension: Secondary | ICD-10-CM | POA: Diagnosis not present

## 2024-07-26 DIAGNOSIS — K219 Gastro-esophageal reflux disease without esophagitis: Secondary | ICD-10-CM | POA: Diagnosis not present

## 2024-07-26 DIAGNOSIS — Z23 Encounter for immunization: Secondary | ICD-10-CM | POA: Diagnosis not present

## 2024-07-26 DIAGNOSIS — E1169 Type 2 diabetes mellitus with other specified complication: Secondary | ICD-10-CM | POA: Diagnosis not present

## 2024-07-26 DIAGNOSIS — I471 Supraventricular tachycardia, unspecified: Secondary | ICD-10-CM | POA: Diagnosis not present

## 2024-07-26 DIAGNOSIS — E039 Hypothyroidism, unspecified: Secondary | ICD-10-CM | POA: Diagnosis not present

## 2024-07-26 DIAGNOSIS — E7800 Pure hypercholesterolemia, unspecified: Secondary | ICD-10-CM | POA: Diagnosis not present

## 2024-07-26 DIAGNOSIS — Z6836 Body mass index (BMI) 36.0-36.9, adult: Secondary | ICD-10-CM | POA: Diagnosis not present

## 2024-07-26 DIAGNOSIS — I10 Essential (primary) hypertension: Secondary | ICD-10-CM | POA: Diagnosis not present

## 2024-07-26 DIAGNOSIS — F3342 Major depressive disorder, recurrent, in full remission: Secondary | ICD-10-CM | POA: Diagnosis not present

## 2024-08-24 DIAGNOSIS — K219 Gastro-esophageal reflux disease without esophagitis: Secondary | ICD-10-CM | POA: Diagnosis not present

## 2024-08-24 DIAGNOSIS — E1169 Type 2 diabetes mellitus with other specified complication: Secondary | ICD-10-CM | POA: Diagnosis not present

## 2024-08-24 DIAGNOSIS — G894 Chronic pain syndrome: Secondary | ICD-10-CM | POA: Diagnosis not present

## 2024-08-24 DIAGNOSIS — E7800 Pure hypercholesterolemia, unspecified: Secondary | ICD-10-CM | POA: Diagnosis not present

## 2024-08-24 DIAGNOSIS — I10 Essential (primary) hypertension: Secondary | ICD-10-CM | POA: Diagnosis not present

## 2024-08-24 DIAGNOSIS — M150 Primary generalized (osteo)arthritis: Secondary | ICD-10-CM | POA: Diagnosis not present

## 2024-08-24 DIAGNOSIS — Z79899 Other long term (current) drug therapy: Secondary | ICD-10-CM | POA: Diagnosis not present

## 2024-09-18 DIAGNOSIS — E119 Type 2 diabetes mellitus without complications: Secondary | ICD-10-CM | POA: Diagnosis not present

## 2024-09-23 DIAGNOSIS — K219 Gastro-esophageal reflux disease without esophagitis: Secondary | ICD-10-CM | POA: Diagnosis not present

## 2024-09-23 DIAGNOSIS — Z79899 Other long term (current) drug therapy: Secondary | ICD-10-CM | POA: Diagnosis not present

## 2024-09-23 DIAGNOSIS — E7800 Pure hypercholesterolemia, unspecified: Secondary | ICD-10-CM | POA: Diagnosis not present

## 2024-09-23 DIAGNOSIS — I10 Essential (primary) hypertension: Secondary | ICD-10-CM | POA: Diagnosis not present

## 2024-10-01 DIAGNOSIS — M961 Postlaminectomy syndrome, not elsewhere classified: Secondary | ICD-10-CM | POA: Diagnosis not present
